# Patient Record
Sex: Female | Born: 1960 | Race: White | Hispanic: No | Marital: Married | Smoking: Never smoker
Health system: Western US, Community
[De-identification: ages and names within clinical notes are randomized; demographics above are authoritative.]

## PROBLEM LIST (undated history)

## (undated) DIAGNOSIS — E28319 Asymptomatic premature menopause: Secondary | ICD-10-CM

## (undated) DIAGNOSIS — F329 Major depressive disorder, single episode, unspecified: Secondary | ICD-10-CM

## (undated) DIAGNOSIS — I10 Essential (primary) hypertension: Secondary | ICD-10-CM

## (undated) DIAGNOSIS — N809 Endometriosis, unspecified: Secondary | ICD-10-CM

## (undated) DIAGNOSIS — F32A Depression, unspecified: Secondary | ICD-10-CM

## (undated) DIAGNOSIS — N979 Female infertility, unspecified: Secondary | ICD-10-CM

## (undated) HISTORY — DX: Major depressive disorder, single episode, unspecified: F32.9

## (undated) HISTORY — DX: Endometriosis, unspecified: N80.9

## (undated) HISTORY — DX: Asymptomatic premature menopause: E28.319

## (undated) HISTORY — PX: LAPAROSCOPY: SHX197

## (undated) HISTORY — DX: Depression, unspecified: F32.A

## (undated) HISTORY — PX: TONSILLECTOMY: SUR1361

## (undated) HISTORY — DX: Female infertility, unspecified: N97.9

## (undated) HISTORY — PX: BREAST CYST ASPIRATION: SHX578

---

## 1997-09-14 ENCOUNTER — Other Ambulatory Visit: Admission: RE | Admit: 1997-09-14 | Discharge: 1997-09-14 | Payer: Self-pay | Admitting: Gynecology

## 1998-08-22 ENCOUNTER — Other Ambulatory Visit: Admission: RE | Admit: 1998-08-22 | Discharge: 1998-08-22 | Payer: Self-pay | Admitting: Gynecology

## 1999-06-04 ENCOUNTER — Other Ambulatory Visit: Admission: RE | Admit: 1999-06-04 | Discharge: 1999-06-04 | Payer: Self-pay | Admitting: Gynecology

## 1999-07-23 ENCOUNTER — Encounter: Payer: Self-pay | Admitting: Gynecology

## 1999-07-23 ENCOUNTER — Encounter: Admission: RE | Admit: 1999-07-23 | Discharge: 1999-07-23 | Payer: Self-pay | Admitting: Gynecology

## 1999-09-27 ENCOUNTER — Encounter: Admission: RE | Admit: 1999-09-27 | Discharge: 1999-09-27 | Payer: Self-pay

## 2000-06-29 ENCOUNTER — Other Ambulatory Visit: Admission: RE | Admit: 2000-06-29 | Discharge: 2000-06-29 | Payer: Self-pay | Admitting: Gynecology

## 2001-08-13 ENCOUNTER — Other Ambulatory Visit: Admission: RE | Admit: 2001-08-13 | Discharge: 2001-08-13 | Payer: Self-pay | Admitting: Gynecology

## 2001-11-01 ENCOUNTER — Encounter: Admission: RE | Admit: 2001-11-01 | Discharge: 2001-11-01 | Payer: Self-pay | Admitting: Gynecology

## 2001-11-01 ENCOUNTER — Encounter: Payer: Self-pay | Admitting: Gynecology

## 2002-09-07 ENCOUNTER — Other Ambulatory Visit: Admission: RE | Admit: 2002-09-07 | Discharge: 2002-09-07 | Payer: Self-pay | Admitting: Gynecology

## 2003-06-09 ENCOUNTER — Encounter: Admission: RE | Admit: 2003-06-09 | Discharge: 2003-06-09 | Payer: Self-pay

## 2003-07-12 ENCOUNTER — Encounter: Admission: RE | Admit: 2003-07-12 | Discharge: 2003-07-12 | Payer: Self-pay | Admitting: Gynecology

## 2003-10-02 ENCOUNTER — Other Ambulatory Visit: Admission: RE | Admit: 2003-10-02 | Discharge: 2003-10-02 | Payer: Self-pay | Admitting: Gynecology

## 2004-02-13 ENCOUNTER — Other Ambulatory Visit: Admission: RE | Admit: 2004-02-13 | Discharge: 2004-02-13 | Payer: Self-pay | Admitting: Gynecology

## 2004-10-08 ENCOUNTER — Other Ambulatory Visit: Admission: RE | Admit: 2004-10-08 | Discharge: 2004-10-08 | Payer: Self-pay | Admitting: Gynecology

## 2004-10-22 ENCOUNTER — Encounter: Admission: RE | Admit: 2004-10-22 | Discharge: 2004-10-22 | Payer: Self-pay | Admitting: Gynecology

## 2005-11-19 ENCOUNTER — Other Ambulatory Visit: Admission: RE | Admit: 2005-11-19 | Discharge: 2005-11-19 | Payer: Self-pay | Admitting: Gynecology

## 2005-11-24 ENCOUNTER — Encounter: Admission: RE | Admit: 2005-11-24 | Discharge: 2005-11-24 | Payer: Self-pay | Admitting: Gynecology

## 2005-12-08 ENCOUNTER — Encounter: Admission: RE | Admit: 2005-12-08 | Discharge: 2005-12-08 | Payer: Self-pay | Admitting: Gynecology

## 2006-12-07 ENCOUNTER — Other Ambulatory Visit: Admission: RE | Admit: 2006-12-07 | Discharge: 2006-12-07 | Payer: Self-pay | Admitting: Gynecology

## 2007-01-20 ENCOUNTER — Encounter: Admission: RE | Admit: 2007-01-20 | Discharge: 2007-01-20 | Payer: Self-pay | Admitting: Gynecology

## 2008-04-04 ENCOUNTER — Encounter: Admission: RE | Admit: 2008-04-04 | Discharge: 2008-04-04 | Payer: Self-pay | Admitting: Gynecology

## 2009-08-13 ENCOUNTER — Encounter: Admission: RE | Admit: 2009-08-13 | Discharge: 2009-08-13 | Payer: Self-pay | Admitting: Gynecology

## 2010-11-18 ENCOUNTER — Encounter: Payer: Self-pay | Admitting: Gastroenterology

## 2010-11-18 HISTORY — PX: COLONOSCOPY: SHX174

## 2010-12-31 ENCOUNTER — Other Ambulatory Visit: Payer: Self-pay | Admitting: Family Medicine

## 2010-12-31 ENCOUNTER — Other Ambulatory Visit: Payer: Self-pay | Admitting: *Deleted

## 2010-12-31 DIAGNOSIS — Z1231 Encounter for screening mammogram for malignant neoplasm of breast: Secondary | ICD-10-CM

## 2011-01-07 ENCOUNTER — Ambulatory Visit
Admission: RE | Admit: 2011-01-07 | Discharge: 2011-01-07 | Disposition: A | Discharge status: Home / Self Care | Payer: BC Managed Care – PPO | Source: Ambulatory Visit | Attending: Family Medicine | Admitting: Family Medicine

## 2011-01-07 DIAGNOSIS — Z1231 Encounter for screening mammogram for malignant neoplasm of breast: Secondary | ICD-10-CM

## 2012-07-09 ENCOUNTER — Ambulatory Visit (HOSPITAL_COMMUNITY)
Admission: AD | Admit: 2012-07-09 | Discharge: 2012-07-09 | Disposition: A | Discharge status: Home / Self Care | Payer: BC Managed Care – PPO | Attending: Psychiatry | Admitting: Psychiatry

## 2012-07-09 ENCOUNTER — Encounter (HOSPITAL_COMMUNITY): Payer: Self-pay | Admitting: *Deleted

## 2012-07-09 DIAGNOSIS — I10 Essential (primary) hypertension: Secondary | ICD-10-CM

## 2012-07-09 DIAGNOSIS — F411 Generalized anxiety disorder: Secondary | ICD-10-CM | POA: Insufficient documentation

## 2012-07-09 DIAGNOSIS — F331 Major depressive disorder, recurrent, moderate: Secondary | ICD-10-CM | POA: Insufficient documentation

## 2012-07-09 HISTORY — DX: Essential (primary) hypertension: I10
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
## 2012-08-11 ENCOUNTER — Other Ambulatory Visit: Payer: Self-pay | Admitting: Family Medicine

## 2012-08-11 DIAGNOSIS — Z780 Asymptomatic menopausal state: Secondary | ICD-10-CM

## 2012-08-11 DIAGNOSIS — Z1231 Encounter for screening mammogram for malignant neoplasm of breast: Secondary | ICD-10-CM

## 2012-09-03 ENCOUNTER — Ambulatory Visit: Payer: Self-pay

## 2012-09-03 ENCOUNTER — Other Ambulatory Visit: Payer: Self-pay

## 2013-03-11 ENCOUNTER — Ambulatory Visit
Admission: RE | Admit: 2013-03-11 | Discharge: 2013-03-11 | Disposition: A | Discharge status: Home / Self Care | Payer: BC Managed Care – PPO | Source: Ambulatory Visit | Attending: Family Medicine | Admitting: Family Medicine

## 2013-03-11 DIAGNOSIS — Z780 Asymptomatic menopausal state: Secondary | ICD-10-CM

## 2013-03-11 DIAGNOSIS — Z1231 Encounter for screening mammogram for malignant neoplasm of breast: Secondary | ICD-10-CM

## 2014-03-10 ENCOUNTER — Other Ambulatory Visit: Payer: Self-pay | Admitting: Family Medicine

## 2014-03-10 DIAGNOSIS — Z1231 Encounter for screening mammogram for malignant neoplasm of breast: Secondary | ICD-10-CM

## 2014-03-17 ENCOUNTER — Ambulatory Visit: Payer: Self-pay

## 2014-03-21 ENCOUNTER — Ambulatory Visit: Payer: Self-pay

## 2014-11-13 ENCOUNTER — Other Ambulatory Visit: Payer: Self-pay

## 2014-11-13 DIAGNOSIS — Z1231 Encounter for screening mammogram for malignant neoplasm of breast: Secondary | ICD-10-CM

## 2014-12-13 ENCOUNTER — Ambulatory Visit
Admission: RE | Admit: 2014-12-13 | Discharge: 2014-12-13 | Disposition: A | Discharge status: Home / Self Care | Payer: BLUE CROSS/BLUE SHIELD | Source: Ambulatory Visit

## 2014-12-13 DIAGNOSIS — Z1231 Encounter for screening mammogram for malignant neoplasm of breast: Secondary | ICD-10-CM

## 2016-02-18 NOTE — Telephone Encounter (Signed)
Patient has a new patient appointment with Claudia Soto on 03/18/16. Patient has been pre-registered and abstracted. Mailed welcome packet on 02/18/16. Patient is aware of the clinic's [facility] medication policy.

## 2016-02-26 ENCOUNTER — Other Ambulatory Visit: Payer: Self-pay | Admitting: Family Medicine

## 2016-02-26 ENCOUNTER — Other Ambulatory Visit: Payer: Self-pay | Admitting: Physician Assistant

## 2016-02-26 DIAGNOSIS — Z1231 Encounter for screening mammogram for malignant neoplasm of breast: Secondary | ICD-10-CM

## 2016-03-04 ENCOUNTER — Ambulatory Visit
Admission: RE | Admit: 2016-03-04 | Discharge: 2016-03-04 | Disposition: A | Discharge status: Home / Self Care | Payer: BLUE CROSS/BLUE SHIELD | Source: Ambulatory Visit | Attending: Physician Assistant | Admitting: Physician Assistant

## 2016-03-04 DIAGNOSIS — Z1231 Encounter for screening mammogram for malignant neoplasm of breast: Secondary | ICD-10-CM

## 2016-03-18 ENCOUNTER — Ambulatory Visit: Admit: 2016-03-18 | Payer: PRIVATE HEALTH INSURANCE | Attending: Family | Primary: MD

## 2016-03-18 DIAGNOSIS — J4540 Moderate persistent asthma, uncomplicated: Secondary | ICD-10-CM

## 2016-03-18 NOTE — Patient Instructions (Addendum)
You may try the Half Somersault Maneuver. You can see a video and get instruction on the website.  [email or web address]    Please have your labs done at your earliest convenience.    Your labs are fasting. You can have your labs drawn at the [facility]), or at [facility] laboratory.     Please remember if your labs need to be fasting,  no food or drink at least 8 hours prior to having them drawn. Water and medications are ok.     Our office will contact you when lab results have been returned and reviewed by your provider.    Lab locations and hours:    [facility]) - 7:30 am to 4 pm, Monday-Friday only  [facility] (Outpatient center) - 6 am - 6 pm, Monday-Friday. Saturday 7:30 am - 1 pm.   [facility]) - 8 am - 5 pm, Monday - Friday only    We have sent in a referral in to ENT and Pulmonary.  They should be contacting you to schedule and appointment for the exam.  If you have not received a call within two weeks please give our office a call.

## 2016-03-18 NOTE — Assessment & Plan Note (Addendum)
Immunizations     Name Date Dose VIS Date Route    Flu Quadrivalent Injectable (PF) 03/18/2016 0.5 mL 01/06/2014 Intramuscular    Site: Right deltoid    Given By: Chari Manning, CMA    Documented By: Chari Manning, CMA    Manufacturer: Sheran Lawless    Lot: [id number]    NDC: [id number]    Expiration Date: 11/30/2018    Tdap 03/18/2016 0.5 mL 07/26/2013 Intramuscular    Site: Right deltoid    Given By: Chari Manning, CMA    Documented By: Chari Manning, CMA    Manufacturer: Sheran Lawless    Lot: [id number]    NDC: [id number]    Expiration Date: 09/26/2017        The patient has been counseled on the importance of yearly influenza vaccination in reducing complications related to the flu, including death, and is offered the vaccine at today's visit. 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 She has received routine flu vaccination screening, meets the criteria required to receive the vaccine, and is provided the most updated VIS. Patient has tolerated the injection well and has no sign of adverse reaction.

## 2016-03-18 NOTE — Assessment & Plan Note (Signed)
Immunizations     Name Date Dose VIS Date Route    Flu Quadrivalent Injectable (PF) 03/18/2016 0.5 mL 01/06/2014 Intramuscular    Site: Right deltoid    Given By: Chari Manning, CMA    Documented By: Chari Manning, CMA    Manufacturer: Sheran Lawless    Lot: [id number]    NDC: [id number]    Expiration Date: 11/30/2018    Tdap 03/18/2016 0.5 mL 07/26/2013 Intramuscular    Site: Right deltoid    Given By: Chari Manning, CMA    Documented By: Chari Manning, CMA    Manufacturer: Sheran Lawless    Lot: [id number]    NDC: [id number]    Expiration Date: 09/26/2017

## 2016-03-18 NOTE — Assessment & Plan Note (Signed)
Stable. Not worsening.  Patient is given information on Half Somersault Maneuver by Dr. Alpha Gula.  Referred to ENT.

## 2016-03-18 NOTE — Progress Notes (Addendum)
Claudia Soto is a 55 y.o. female.  Chief Complaint   Patient presents with   . Establish Care   . Dizziness   . Fatigue       ASSESSMENT & PLAN     Problem List Items Addressed This Visit        Respiratory    Reactive airway disease - Primary (Chronic)     Currently treated with Qvar and albuterol rescue. She has a history of hospitalizations for bronchitis and pneumonia, significantly improved after receiving pneumonia vaccines.  She does not ever recall having pulmonary function testing and she has not had a consult with a pulmonologist.   I have referred her to pulmonology.  For now no changes are made as she feels adequately controlled.  She does not need refills on her inhalers.         Relevant Orders    Referral to Pulmonary Consultants       Other    Vertigo (Chronic)     Stable. Not worsening.  Patient is given information on Half Somers[facility] Maneuver by Dr. Alpha Gula.  Referred to ENT.          Relevant Orders    Referral to ENT ([place])    Tinnitus (Chronic)    Relevant Orders    Referral to ENT ([place])    Flu vaccine need     Immunizations     Name Date Dose VIS Date Route    Flu Quadrivalent Injectable (PF) 03/18/2016 0.5 mL 01/06/2014 Intramuscular    Site: Right deltoid    Given By: Chari Manning, CMA    Documented By: Chari Manning, CMA    Manufacturer: Sheran Lawless    Lot: [id number]    NDC: [id number]    Expiration Date: 11/30/2018    Tdap 03/18/2016 0.5 mL 07/26/2013 Intramuscular    Site: Right deltoid    Given By: Chari Manning, CMA    Documented By: Chari Manning, CMA    Manufacturer: Sheran Lawless    Lot: [id number]    NDC: [id number]    Expiration Date: 09/26/2017        The patient has been counseled on the importance of yearly influenza vaccination in reducing complications related to the flu, including death, and is offered the vaccine at today's visit. 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 She has received routine flu  vaccination screening, meets the criteria required to receive the vaccine, and is provided the most updated VIS. Patient has tolerated the injection well and has no sign of adverse reaction.          Relevant Orders    Flu Vaccine >= 3yo Intramuscular (Preservative Free) (Completed)    Need for Tdap vaccination     Immunizations     Name Date Dose VIS Date Route    Flu Quadrivalent Injectable (PF) 03/18/2016 0.5 mL 01/06/2014 Intramuscular    Site: Right deltoid    Given By: Chari Manning, CMA    Documented By: Chari Manning, CMA    Manufacturer: Sheran Lawless    Lot: [id number]    NDC: [id number]    Expiration Date: 11/30/2018    Tdap 03/18/2016 0.5 mL 07/26/2013 Intramuscular    Site: Right deltoid    Given By: Chari Manning, CMA    Documented By: Chari Manning, CMA    Manufacturer: Sheran Lawless    Lot: [id number]    NDC: [id number]    Expiration Date: 09/26/2017  Relevant Orders    Tdap (ADACEL, BOOSTRIX) (Completed)    Administration of Immunizations, single (Completed)    Fatigue     Started during the summer. She feels significant fatigue and difficulty concentrating.  She admits her weight and deconditioning may be a contributing factor.  We will get routine labs, including thyroid, cbc and iron study.   She is supplementing with Vitamin B12 and vitamin D.  She is encouraged to aim for at least 30 minutes of physical activity 5 days a week.  Further recommendations pending lab results.         Relevant Orders    Thyroid Stimulating Hormone -Routine (Completed)    T3, Free -Routine (Completed)    T4, Free -Routine (Completed)    Ferritin -Routine (Completed)    Iron & Total Iron Binding Capacity -Routine (Completed)    Encounter to establish care    Relevant Orders    25-OH Vitamin D Total D2+D3 -Routine (Completed)    CBC with Auto Differential -Routine (Completed)    Comprehensive Metabolic Panel -Routine (Completed)    Coronary Risk Lipid Panel -Routine (Completed)    Thyroid Stimulating  Hormone -Routine (Completed)      Other Visit Diagnoses     Non-smoker        BMI 40.0-44.9, adult (CMS/HCC)        Encounter for screening mammogram for breast cancer        Relevant Orders    Mammography tomo screening bilateral digital w cad        Health Maintenance  Colonoscopy:She will check records  Hepatitis C Screening: Agrees to screening test.  Mammogram:Due November 2017    Return in about 3 months (around 06/18/2016) for fatigue.    Medications Discontinued During This Encounter   Medication Reason   . fluticasone (FLOVENT HFA) 220 mcg/actuation inhaler Error       We had a thoughtful and careful discussion regarding the issues today. All questions were answered. The patient is in agreement with the plan of care and verbalizes understanding of all instructions.  I encouraged followup as needed.     SUBJECTIVE     Claudia Soto is a 55 y.o. female who presents today for Establish Care; Dizziness; and Fatigue       Patient history, allergies, medications and HPI were given by patient.     Patient Care Team:  Aleda Grana, FNP-C as PCP - General (Family Medicine)    HISTORY OF PRESENT ILLNESS     HPI Comments:   The patient is here to establish care, she is transferring from Dr. Josepha Pigg.     She resides in [place].     Fatigue:  She has been tired for the last few months. Also having brain fog.  She has tried iron but did not help. She had a single episode of palpitations two nights ago, felt fast pounding in her chest for about 5 seconds. No PND, no claudication. Has dyspnea on exertion, none at rest. No history of thyroid problems.   Difficulty losing weight. Hx of 100 lb weight loss with diet and exercise but regained.   History of anemia during childbearing years.    Lung Issues:  From a child she notes lung problems, URI's easily turns into bronchitis and pneumonia.  Receiving the pneumonia vaccine seemed to help. Her last hospitalization for respiratory issues was about 12 years ago.  She has  been using Qvar and albuterol rescue inhaler.  She has never seen  a pulmonologist or had pulmonary function test.  She experiences shortness of breath with exertion, sometimes awakens at night with cough.  Has never smoked. Denies exposures to respiratory toxins.    Right ear tinnitus:  Occurred after a very bad ear/sinus infection about 10 years ago. She also experienced vertigo. Up until 2 months ago the vertigo had stopped. She has had two episodes of pretty severe vertigo in the last couple of months though, each lasting a couple of days. She uses Flonase and Zyrtec which is helpful for the ringing.     Health Maintenance  Colonoscopy:(Family Hx of colon polyps) 3 years ago - repeat at 5 yr  Hepatitis C Screening (birth year 81-1965): Never  Lung Cancer Screening:N/A    Women  DXA: N/A  Mammogram:04/18/15  Pap Smear: Total hysterectomy    Father with AAA    Vaccines:  Tetanus: Due  Pneumonia:Pneumo 23 given 01/03/15, previously received Prevnar - Complete    Past Medical History:   Diagnosis Date   . Anemia 1982   . 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 Asthma 1960/12/18    seasonal asthma, diagnosed as an infant   . Bronchitis     Patient has had several times   . GERD (gastroesophageal reflux disease) 2007   . Miscarriage     2x   . Pneumonia     Patient has had several times       Past Surgical History:   Procedure Laterality Date   . HYSTERECTOMY  07/2003   . LASIK Bilateral 2007    2x   . MOUTH SURGERY  2005    Had to front teeth pulled and replaced with implants   . OOPHORECTOMY Right 07/2003       Allergies   Allergen Reactions   . Vicodin [Hydrocodone-Acetaminophen] Nausea And Vomiting       Outpatient Prescriptions Marked as Taking for the 03/18/16 encounter (Office Visit) with Aleda Grana, FNP-C   Medication Sig Dispense Refill   . beclomethasone (QVAR) 80 mcg/actuation inhaler Inhale 2 puffs into the lungs 2 times daily.     Marland Kitchen BLACK COHOSH ORAL Take by mouth daily.     Marland Kitchen CALCIUM-MAGNESIUM ORAL  Take by mouth daily.     . cetirizine (ZYRTEC) 10 mg Cap Take by mouth daily.     Graciella Freer MED daily. Med Name: Thyroid Energy- once daily and Bladderwrack- once daily     . cyanocobalamin, vitamin B-12, (VITAMIN B-12) 1,000 mcg Subl Place under the tongue daily.     . fluticasone (FLONASE) 50 mcg/actuation nasal spray 1 spray by Each Nare route daily.     . MULTIVITAMIN ORAL Take by mouth daily.     . [DISCONTINUED] fluticasone (FLOVENT HFA) 220 mcg/actuation inhaler Inhale 2 puffs into the lungs daily as needed.         Social History     Social History Main Topics   . Smoking status: Never Smoker   . Smokeless tobacco: Never Used      Comment: No smoke exposure   . Alcohol use No   . Drug use: No   . Sexual activity: Yes     Partners: Male     Birth control/ protection: Surgical     Social History   . Marital status: Married     Spouse name: Nadine Counts   . Number of children: 5   . Years of education: N/A     Other Topics  Concern   . Caffeine Concern Yes     1 cup of coffee or a can of soda daily   . Exercise Yes     walking the dogs or goes to the gym  5-6 days weekly       Family History   Problem Relation Age of Onset   . Cervical cancer Mother    . Aortic aneurysm Father    . Colon polyps Father    . Parkinsonism Father    . Alcohol abuse Sister    . No Known Health Problems Brother    . No Known Health Problems Daughter    . No Known Health Problems Son    . Alzheimer's disease Maternal Grandmother    . Diabetes Paternal Grandfather    . No Known Health Problems Son    . No Known Health Problems Daughter    . No Known Health Problems Daughter        Health Maintenance       Date Due Completion Dates    CERVICAL CANCER SCREENING 07/13/1981 ---    COLORECTAL CANCER SCREENING 07/13/2010 ---    BREAST CANCER SCREENING 04/17/2017 04/18/2015          The following portions of the patient's history were reviewed and updated as appropriate: allergies, current medications, family history, past medical history, past surgical  history and problem list.    REVIEW OF SYSTEMS     Review of Systems   Constitutional: Positive for fatigue. Negative for activity change, appetite change, fever and unexpected weight change.   HENT: Positive for tinnitus (right). Negative for sore throat and trouble swallowing.    Respiratory: Positive for shortness of breath. Negative for cough.    Cardiovascular: Positive for palpitations and leg swelling (with prolonged sitting). Negative for chest pain.   Gastrointestinal: Negative for abdominal distention, abdominal pain, constipation, diarrhea, nausea and vomiting.   Genitourinary: Negative for dysuria and hematuria.   Musculoskeletal: Positive for arthralgias (knees).   Skin:        Dry skin.  No new, concerning, or changing skin lesions   Neurological: Negative for weakness, light-headedness and headaches.   Psychiatric/Behavioral: Positive for sleep disturbance (some nights).       OBJECTIVE     BP 130/80 (BP Location: Left arm, Patient Position: Sitting)  Pulse 89  Temp 36.4 ?C (97.6 ?F) (Oral)   Resp 16  Ht 5' 8 (1.727 m)  Wt 266 lb 9.6 oz (120.9 kg)  SpO2 97%  BMI 40.54 kg/m2  BP Readings from Last 3 Encounters:   03/18/16 130/80     Wt Readings from Last 3 Encounters:   03/18/16 266 lb 9.6 oz (120.9 kg)     PF Readings from Last 3 Encounters:   No data found for PF     Body mass index is 40.54 kg/(m^2).    No results found for this visit on 03/18/16 (from the past 48 hour(s)).     PHYSICAL EXAM     Physical Exam   Constitutional: She is oriented to person, place, and time and well-developed, well-nourished, and in no distress.   HENT:   Head: Normocephalic and atraumatic.   Nose: Nose normal.   Mouth/Throat: Uvula is midline, oropharynx is clear and moist and mucous membranes are normal.    Hearing normal to conversational voice. External ear and canal normal. TMs pearly gray with cone of light and bony landmarks visible.    Eyes: Pupils are equal, round, and  reactive to light.   Eyes are  moist, sclera clear without icterus, conjunctiva pink   Neck: Trachea normal. Neck supple. Normal carotid pulses present. Carotid bruit is not present. No thyroid mass and no thyromegaly present.   Cardiovascular: Normal rate, regular rhythm and normal heart sounds.  Exam reveals no gallop and no friction rub.    No murmur heard.  Carotid and radial pulses are 2+ and equal bilaterally.  No edema is present.   Pulmonary/Chest: Effort normal and breath sounds normal.   Abdominal: Soft. Normal appearance and bowel sounds are normal. There is no tenderness.   Lymphadenopathy:   No head or cervical lymphadenopathy.    Neurological: She is alert and oriented to person, place, and time. Gait normal.   Patient sitting upright in exam chair, rises easily to stand. Reflexes 2+ and equal bilaterally.   Skin: Skin is warm, dry and intact. No rash noted. No cyanosis.   Psychiatric:   Pleasant mood, affect appropriate. Engaged in visit and has good insight into matters of her  health. Able to recall recent and past memories.   Vitals reviewed.    ----------------------------------------------------------------------------------------------------------------------    This note was transcribed using speech recognition software. Please contact us for clarification if any questions arise relating to the wording of this document.

## 2016-03-18 NOTE — Assessment & Plan Note (Signed)
Started during the summer. She feels significant fatigue and difficulty concentrating.  She admits her weight and deconditioning may be a contributing factor.  We will get routine labs, including thyroid, cbc and iron study.   She is supplementing with Vitamin B12 and vitamin D.  She is encouraged to aim for at least 30 minutes of physical activity 5 days a week.  Further recommendations pending lab results.

## 2016-03-18 NOTE — Assessment & Plan Note (Addendum)
Currently treated with Qvar and albuterol rescue. She has a history of hospitalizations for bronchitis and pneumonia, significantly improved after receiving pneumonia vaccines.  She does not ever recall having pulmonary function testing and she has not had a consult with a pulmonologist.   I have referred her to pulmonology.  For now no changes are made as she feels adequately controlled.  She does not need refills on her inhalers.

## 2016-03-19 MED ORDER — cholecalciferol, vitamin D3, 5,000 unit capsule
125 | ORAL_CAPSULE | Freq: Every day | ORAL | 2 refills | 43.00000 days | Status: DC
Start: 2016-03-19 — End: 2016-06-17

## 2016-04-21 ENCOUNTER — Ambulatory Visit: Payer: PRIVATE HEALTH INSURANCE | Primary: MD

## 2016-05-19 ENCOUNTER — Encounter: Payer: PRIVATE HEALTH INSURANCE | Attending: Medical | Primary: MD

## 2016-05-19 ENCOUNTER — Encounter: Payer: PRIVATE HEALTH INSURANCE | Primary: MD

## 2016-06-16 NOTE — Progress Notes (Signed)
Patient has appt in our office tomorrow with provider. Per APP protocol - generated chest x-ray order to be done prior to appt.    Closing encounter.

## 2016-06-17 ENCOUNTER — Ambulatory Visit
Admit: 2016-06-17 | Discharge: 2016-06-17 | Payer: PRIVATE HEALTH INSURANCE | Attending: Critical Care Medicine | Primary: MD

## 2016-06-17 ENCOUNTER — Ambulatory Visit: Admit: 2016-06-17 | Payer: PRIVATE HEALTH INSURANCE | Attending: Audiologist | Primary: MD

## 2016-06-17 ENCOUNTER — Ambulatory Visit: Admit: 2016-06-17 | Payer: PRIVATE HEALTH INSURANCE | Attending: Medical | Primary: MD

## 2016-06-17 ENCOUNTER — Inpatient Hospital Stay
Admit: 2016-06-17 | Discharge: 2016-06-17 | Payer: PRIVATE HEALTH INSURANCE | Attending: Critical Care Medicine | Primary: MD

## 2016-06-17 DIAGNOSIS — Z1383 Encounter for screening for respiratory disorder NEC: Secondary | ICD-10-CM

## 2016-06-17 DIAGNOSIS — H9319 Tinnitus, unspecified ear: Secondary | ICD-10-CM

## 2016-06-17 DIAGNOSIS — J45909 Unspecified asthma, uncomplicated: Secondary | ICD-10-CM

## 2016-06-17 DIAGNOSIS — R42 Dizziness and giddiness: Secondary | ICD-10-CM

## 2016-06-17 NOTE — Progress Notes (Signed)
PATIENT:    Claudia Soto  DATE:   06/17/2016   DATE OF BIRTH:   01/06/1961    Thank you for your kind referral please feel free to contact me with any questions at [phone]  Or directly On DOC halo.    Assessment and Plan:  -Asthma:   based on clinical symptoms.  She does not have any foxed airway obstruction.  She has npo emphysema on CT scan and never smoked.  The diagnosis of asthma is based on clinical symptoms and response to inhaled steroids.  I do not feel Methacholine challenge is necessary.    Cont q var 80 1 puff BID.  If needing albuterol more than 2 times/ week change to 2 puffs BID.      Obesity:    Insomnia: referred to insomnia clinic.  I would not consider Sleep apnea testing until her insomnia is better controlled.        DIAGNOSITIC STUDIES: (Personally reviewed)    Images:chest x-ray images reviewed and shows no acute abnormality.  PFTs:Normal spirometry w no sig response to inhaled bronchodilators.   Normal lung volumes.  Normal diffusion not corrected for HB.  The use of ICS may affect bronchodilator response results  Labs:CBC: HB: 13.6   eos 100    REFERRRING PROVIDER: Aleda Grana., *      HISTORY OF PRESENT ILLNESS:   This is a 56 y.o. female with a history of asthma, anemia, insomnia  She experiences dyspnea and wheezing when exposed to strong smells, perfumes.  She had the symptoms for many years and was told she had COPD.  She had those symptoms for years, they respond to albuteorl, have been stable over the years.  She uses her albuterol 1-2 weekly.  Never smoked.      The patient denies occupational exposure.  The patient denies exposure to medications that can cause lung disease, radiation or chemotherapy.        Review of systems:  Constitutional:  No recent fevers, chills, weight gain, night sweats.   ENT:  No facial pain/pressure, rhinorrhea/bloody nasal discharge, nasal stuffiness, difficulty swallowing, odynophagia, or change in voice/hoarseness.  Eyes:  no  discharge, no itching.  Pulmonary: SEE HPI, negative otherwise.  Cardiovascular:  No Orthopnea, Paroxysmal Nocturnal Dyspnea,   GI:  No abdominal pain, nausea, vomiting, hematochezia, melena, heartburn, or reflux.    Endocrine:  No flushing, symptomatic hypoglycemia, polyuria, or polydipsia.   GU:  No dysuria, hematuria, frequency, urgency.  Musculoskeletal:  No new arthritis, arthralgias, myalgias   Dermatologic: No new rashes, pruritis, discoloration   Allergy/ immunology: no food allergies, no immunocompromise.  Neuro:  No new headache, visual disturbance, new muscle weakness, imbalance, seizure, history of stroke.   Hematologic: No easy bruising, easy/excessive bleeding.  Psychiatry: no agitation, no behavioral changes.      Family History   Problem Relation Age of Onset   . Cervical cancer Mother    . Aortic aneurysm Father    . Colon polyps Father    . Parkinsonism Father    . Alcohol abuse Sister    . No Known Health Problems Brother    . No Known Health Problems Daughter    . No Known Health Problems Son    . Alzheimer's disease Maternal Grandmother    . Diabetes Paternal Grandfather    . No Known Health Problems Son    . No Known Health Problems Daughter    . No Known Health Problems Daughter  Family history was reviewed and not pertinent .    Past Medical History:   Diagnosis Date   . Anemia 1982   . 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 Asthma 01-30-61    seasonal asthma, diagnosed as an infant   . Bronchitis     Patient has had several times   . Cough    . GERD (gastroesophageal reflux disease) 2007   . Insomnia 2000   . Miscarriage     2x   . Pneumonia     Patient has had several times   . Vertigo 2008       Past Surgical History:   Procedure Laterality Date   . HYSTERECTOMY  07/2003   . LASIK Bilateral 2007    2x   . MOUTH SURGERY  2005    Had to front teeth pulled and replaced with implants   . OOPHORECTOMY Right 07/2003       Social History     Social History Main Topics   . Smoking status: Never  Smoker   . Smokeless tobacco: Never Used      Comment: No smoke exposure   . Alcohol use No   . Drug use: No   . Sexual activity: No     Social History   . Marital status: Married     Spouse name: Nadine Counts   . Number of children: 5   . Years of education: 76     Other Topics Concern   . Caffeine Concern Yes     1-2 cups of coffee daily   . Exercise Yes     walking the dogs or goes to the gym  5-6 days weekly       Patient Active Problem List    Diagnosis SNOMED CT(R) Date Noted   . Dizziness DIZZINESS 06/17/2016   . Reactive airway disease REACTIVE AIRWAY DISEASE 03/18/2016   . Vertigo VERTIGO 03/18/2016   . Tinnitus TINNITUS 03/18/2016   . Flu vaccine need NEEDS INFLUENZA IMMUNIZATION 03/18/2016   . Need for Tdap vaccination REQUIRES DIPHTHERIA, TETANUS AND PERTUSSIS VACCINATION 03/18/2016   . Fatigue FATIGUE 03/18/2016   . Encounter to establish care ADMINISTRATIVE STATUSES 03/18/2016       Current Outpatient Prescriptions on File Prior to Visit   Medication Sig Dispense Refill   . beclomethasone (QVAR) 80 mcg/actuation inhaler Inhale 2 puffs into the lungs 2 times daily.     Marland Kitchen BLACK COHOSH ORAL Take by mouth daily.     Marland Kitchen CALCIUM-MAGNESIUM ORAL Take by mouth daily.     . cetirizine (ZYRTEC) 10 mg Cap Take by mouth as needed.      Graciella Freer MED daily. Med Name: Thyroid Energy- once daily    Bladderwrack- once daily   Garlinase 1 per day  Grapefruit seed extract 2 per day  Pay d'arco 500mg  2 per day  DGL 2 3 times a day     . cyanocobalamin, vitamin B-12, (VITAMIN B-12) 1,000 mcg Subl Place under the tongue daily.     . fluticasone (FLONASE) 50 mcg/actuation nasal spray 2 sprays by Each Nare route daily.      . MULTIVITAMIN ORAL Take by mouth daily. Women's Multi       No current facility-administered medications on file prior to visit.        Allergies   Allergen Reactions   . Citrus Bioflavonoids  [Bioflavonoids] Other (See Comments)   . Egg Derived Other (See Comments)   .  Vicodin [Hydrocodone-Acetaminophen]  Nausea And Vomiting         Social History   Substance Use Topics   . Smoking status: Never Smoker   . Smokeless tobacco: Never Used      Comment: No smoke exposure   . Alcohol use No           PHYSICAL EXAMINATION:  BP 125/73 (BP Location: Left arm, Patient Position: Sitting)   Pulse 74   Temp 36.3 ?C (97.4 ?F) (Tympanic)   Ht 5' 7 (1.702 m)   Wt 263 lb 6.4 oz (119.5 kg)   SpO2 96% Comment: R/A  BMI 41.25 kg/m?   Body mass index is 41.25 kg/m?Marland Kitchen  GENERAL APPEARANCE:  Comfortable, in no acute distress.  HEAD: Normocephalic.  EYES:  PERRLA, no pallor, mot jaundiced.  ENT, MOUTH:  Normal external ear canal, normal oral and nasal mucosa,nor posterior pharynx, soft and hard palate, normal septum and turbinates , no thrush, nor teeth and gums  NECK:  Supple neck, symmetrical, no deformity, trachea midline,normal thyroid size no palpable nodules,  no LAP.normal jugular veins no distention.   RESPIRATORY:    Symmetrical chest with normal expansion.  Normal  respiratory effort  Good air entry bilateral,   Symmetrical resonant percussion of the chest bilaterally.  No tenderness to palpation.  CARDIOVASCULAR: NL S1 and S2, no M/R/G, normal peripheral pulses, no signs of vasculopathy.   GASTROINTESTINAL:  Soft and lax, no swelling, no tenderness, no organomegaly.  Lymphatic: no cervical or axillary LAP.  MUSCULOSKELETAL: no joint swelling, no redness, no clubbing.  SKIN:  No skin rashes, or lesions, no tenderness to plapation.  NEUROLOGICAL:  Normal gait, nor mal strength in all extremities.  PSYCHIATRIC:  Oriented to time place and person, Normal affect, normal thought process, no signs of depression.  HEMATOLOGIC/LYMPHATIC/IMMUNOLOGIC: No LAP , no ecchymosis          Elyn Peers, MD    CC: Marcucilli, Milas Kocher., *, Aleda Grana, FNP-C      This note was transcribed using speech recognition software. Please contact us for clarification if any questions arise relating to the wording of this document.

## 2016-06-17 NOTE — Progress Notes (Signed)
SUBJECTIVE:     Claudia Soto 56 y.o. female DOB 08/10/1960 who presents for   Chief Complaint   Patient presents with   . Dizziness   . Tinnitus         Otoscopy revealed clear ear canals.    Tympanometric results in right ear were consistent with normal middle ear function. Tympanometric results in left ear were consistent with normal middle ear function.    Pure tone testing showed hearing within normal limits bilaterally, from [id number] Hz.    Speech Reception Threshold in the right ear was 15 dB.  Speech Reception Threshold in the left ear was 15 dB.    Word Recognition score in right ear was excellent. Word Recognition score in left ear was excellent.            ICD-9-CM ICD-10-CM    1. Subjective tinnitus, unspecified laterality 388.31 H93.19 Comprehensive hearing test      Tympanometry         Sandrea Hammond, Au.D.  Doctor of Audiology

## 2016-06-17 NOTE — Progress Notes (Signed)
Chief Complaint   Patient presents with   . Dizziness         HISTORY OF PRESENT ILLNESS     Patient is a 56 y.o. female referred by Claudia Soto, F* who is here for evaluation of spinning sensation that began approximately 2009. Symptoms are have been worse since April 2017 and are worsened. The episodes are come and go. Symptoms seem to be triggered by no identified factor and happens without warning. During the episodes patient denies any hearing loss or tinnitus fluctuations in None ear(s).    The onset of symptoms may have been associated with other cause of ear infections. Patient has not noticed any relationship between the dizziness and eating certain foods.Patient does not use cotton swabs or other object in ears.    Additional associated symptoms include: weakness, uncoordination, nausea, vomiting, fatigue and sweats. Patient specifically denies: unusual headache, vision changes, passing out, numbness/tingling, chest pain, fever, shortness of breath, ear pain, ear drainage and anxiety.    Patient does not have a past medical history of any neurological disorder, anemia, autoimmune disorder, prolonged loud noise exposure, or ear disorder/surgery.    Previous diagnostic studies for patient's chief complaint have not been performed recently. Patient has not been evaluated by another specialist.    Patient states that she has been getting intermittent vertigo symptoms (spinning sensations) over the past 9 months. She states that she has had 4-5 of these episodes during that time. The spinning sensation that can occur without warning and without any identifiable trigger. Position changes do not seem to bring it on and they can happen even when she is sitting still. The vertigo episodes will typically last several hours and have lasted as long as 2 days. She usually takes Dramamine when they occur and will sit in a reclining chair with her eyes closed until it passes. She does not notice any hearing loss  fluctuations but she does have constant tinnitus in her right ear and occasionally she will feel fullness in her right ear when lying on her right side. She has no significant ear history otherwise she had severe infection about 9 or 10 years ago.          Past Medical History:   Diagnosis Date   . Anemia 1982   . 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 Asthma [birth year] 04, 1961    seasonal asthma, diagnosed as an infant   . Bronchitis     Patient has had several times   . Cough    . GERD (gastroesophageal reflux disease) 2007   . Insomnia 2000   . Miscarriage     2x   . Pneumonia     Patient has had several times   . Vertigo 2008     Past Surgical History:   Procedure Laterality Date   . HYSTERECTOMY  07/2003   . LASIK Bilateral 2007    2x   . MOUTH SURGERY  2005    Had to front teeth pulled and replaced with implants   . OOPHORECTOMY Right 07/2003     Family History   Problem Relation Age of Onset   . Cervical cancer Mother    . Aortic aneurysm Father    . Colon polyps Father    . Parkinsonism Father    . Alcohol abuse Sister    . No Known Health Problems Brother    . No Known Health Problems Daughter    . No Known Health  Problems Son    . Alzheimer's disease Maternal Grandmother    . Diabetes Paternal Grandfather    . No Known Health Problems Son    . No Known Health Problems Daughter    . No Known Health Problems Daughter      Allergies   Allergen Reactions   . Citrus Bioflavonoids  [Bioflavonoids] Other (See Comments)   . Egg Derived Other (See Comments)   . Vicodin [Hydrocodone-Acetaminophen] Nausea And Vomiting     Outpatient Prescriptions Marked as Taking for the 06/17/16 encounter (Office Visit) with Lucianne Muss, PA   Medication Sig Dispense Refill   . beclomethasone (QVAR) 80 mcg/actuation inhaler Inhale 2 puffs into the lungs 2 times daily.     Marland Kitchen BLACK COHOSH ORAL Take by mouth daily.     Marland Kitchen CALCIUM-MAGNESIUM ORAL Take by mouth daily.     . cetirizine (ZYRTEC) 10 mg Cap Take by mouth as needed.      Graciella Freer MED  daily. Med Name: Thyroid Energy- once daily    Bladderwrack- once daily   Garlinase 1 per day  Grapefruit seed extract 2 per day  Pay d'arco 500mg  2 per day  DGL 2 3 times a day     . cyanocobalamin, vitamin B-12, (VITAMIN B-12) 1,000 mcg Subl Place under the tongue daily.     . fluticasone (FLONASE) 50 mcg/actuation nasal spray 2 sprays by Each Nare route daily.      . MULTIVITAMIN ORAL Take by mouth daily. Women's Multi       Social History     Social History Main Topics   . Smoking status: Never Smoker   . Smokeless tobacco: Never Used      Comment: No smoke exposure   . Alcohol use No   . Drug use: No   . Sexual activity: No     Social History   . Marital status: Married     Spouse name: Nadine Counts   . Number of children: 5   . Years of education: 23     Other Topics Concern   . Caffeine Concern Yes     1-2 cups of coffee daily   . Exercise Yes     walking the dogs or goes to the gym  5-6 days weekly     History   Smoking Status   . Never Smoker   Smokeless Tobacco   . Never Used     Comment: No smoke exposure       REVIEW OF SYSTEMS   Review of Systems   Constitutional: Positive for malaise/fatigue.   HENT: Positive for congestion, ear pain, hearing loss and tinnitus.    Eyes: Positive for blurred vision, photophobia and redness.   Respiratory: Positive for cough, sputum production, shortness of breath and wheezing.    Cardiovascular: Positive for orthopnea.   Genitourinary: Positive for urgency.   Musculoskeletal: Positive for back pain and joint pain.   Skin: Positive for rash.   Neurological: Positive for dizziness and headaches.     12 point review of systems negative except as noted above.    PHYSICAL EXAM     Body mass index is 39.39 kg/m?Marland Kitchen  Vitals:    06/17/16 1020 06/17/16 1024   BP: 131/85 143/87   Patient Position: Sitting Standing   Pulse: 72 75   Temp: 36.1 ?C (96.9 ?F)    Weight: 259 lb (117.5 kg)    Height: 5' 7.99 (1.727 m)  PHYSICAL EXAMINATION:    CONSTITUTIONAL:  General  Appearance: well nourished, well-developed, alert, oriented, in no acute distress, cooperative with examination  Communication Ability / Voice Quality : communication ability normal, voice quality normal, English     HEAD:  Inspection : normocephalic; symmetrical, no lesions present, no evidence of trauma   Palpation : no tenderness on palpation, no masses on palpation     FACE:  Inspection : normal appearance, no lesions present, no evidence of trauma, jaw position normal   Palpation : frontoethmoidal sinus nontender, maxillary sinuses nontender to palpation, no masses present   Facial Strength : facial motion symmetric, normal eye closure strength bilaterally   Parotid Glands : no tenderness on palpation, no swelling present, no masses present     EYES:  Ocular Motility/Alignment : ocular alignment normal, ocular motility normal, no nystagmus present, no proptosis present   Eyelids : eyelids within normal limits, lacrimal glands within normal limits, orbits within normal limits, no proptosis present   Conjunctiva : conjunctiva normal     EARS:  Hearing : NORMAL AUDIOMETRICS, BILATERAL TYPE A TYMPANOMETRY  External Ears : auricles anatomically normal bilaterally, no auricle lesions present, no auricle tenderness to palpation present   External Auditory Canals: ear canals with no significant cerumen, external auditory canals have normal appearance, no external auditory canal lesions present  Otoscopic/Microscopic Exam :   Right ear: tympanic membranes appear normal, no tympanic membrane lesions present, no tympanic membrane perforations present, no fluid present behind tympanic membranes  Left ear: tympanic membranes appear normal, no tympanic membrane lesions present, no tympanic membrane perforations present, no fluid present behind tympanic membranes  Vestibular System : DIX-HALLPIKE MANEUVER NEGATIVE    NOSE / NASOPHARYNX:  External Nose : appearance normal, no tenderness on palpation, no significant nasal  discharge present, no lesions, no evidence of trauma, nostrils patent without discharge   Intranasal Exam : nasal mucosa pink, moist and within normal limits, vestibule within normal limits, inferior turbinates appear normal, middle turbinates appear normal, nasal septum relatively midline, nasal discharge is clear and minimal, no polyposis seen     ORAL CAVITY / OROPHARYNX:  Lips : upper and lower lips pink, moist, and normal appearing.   Teeth : dentition within normal limits for age   Gums : gingivae healthy   Oral Mucosa : oral mucosa pink and moist with no lesions present   Floor of Mouth : floor of mouth within normal limits, salivary ducts appear patent   Tongue : tongue moist, midline, with symmetrical movements with no lesions seen.   Palate : soft and hard palates within normal limits   Oropharynx : appearance within normal limits, tonsils normal in appearance, peritonsillar regions within normal limits     NECK:  Inspection and Palpation : appearance normal, no masses or tenderness on palpation; trachea midline and freely movable   Thyroid : size of gland normal, no tenderness, nodules or mass present on palpation, position midline   Submandibular Glands : normal size, nontender to palpation   Lymph Nodes : no lymphadenopathy present     CHEST / RESPIRATORY:  Respiratory Effort : breathing is symmetrical and unlabored     CARDIOVASCULAR:  Carotid: normal pulses; no bruits    SKIN AND SUBCUTANEOUS TISSUES:  Skin : normal coloration and skin turgor for age, no rashes     NEUROLOGICAL / PSYCHIATRIC:  Orientation : oriented to time, place and person   Mood and Affect : mood normal, affect appropriate   Cranial  Nerves : CN II-XII appear intact   Gait : ROMBERG MANEUVER NEGATIVE         ASSESSMENT       ICD-9-CM ICD-10-CM    1. Dizziness 780.4 R42 Referral to Audiology (External)      MRI brain with and without contrast   2. 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 Subjective  tinnitus, right 388.31 H93.11         PLAN     Patient with intermittent vertigo that has happened 4 or 5 times over the past 9 months. Symptoms can happen without warning and are not dependent on position changes. She does not have hearing loss or tinnitus fluctuations. However, she does have constant right ear tinnitus that has been present for 9 or 10 years since having a severe infection in the right ear. Occasionally she will feel a fullness in the right ear when she lies down on that side.    Audiometrics demonstrated normal and symmetrical hearing bilaterally with type A tympanograms. Dix-Hallpike and Romberg maneuvers were negative. Her last episode of vertigo was a week and half ago. Etiology is unclear but her symptoms are fairly consistent with possible M?ni?re's disease. We discussed strict low-sodium diet of less than 1500 mg/day. She does not wish to start a diuretic at this time.    I will obtain an MRI of the brain with contrast and a VNG and have her return in 2 months with Dr. Harriette Ohara for evaluation. Patient is instructed to keep a diary of surrounding events when the episodes occur (things she ate or drank, sleep habits, stressful environments, etc.) In an attempt to identify triggers.    Patient instructions were provided and all questions answered.    Return in about 2 months (around 08/15/2016) for Vertigo recheck.    *This note was dictated using voice recognition software. If you have any questions concerning the content, please call our office at [phone].*

## 2016-06-18 ENCOUNTER — Ambulatory Visit: Admit: 2016-06-18 | Discharge: 2016-06-25 | Payer: PRIVATE HEALTH INSURANCE | Attending: Family | Primary: MD

## 2016-06-18 ENCOUNTER — Inpatient Hospital Stay: Admit: 2016-06-18 | Payer: PRIVATE HEALTH INSURANCE | Attending: Family | Primary: MD

## 2016-06-18 DIAGNOSIS — Z1231 Encounter for screening mammogram for malignant neoplasm of breast: Secondary | ICD-10-CM

## 2016-06-18 DIAGNOSIS — R5383 Other fatigue: Secondary | ICD-10-CM

## 2016-06-18 NOTE — Patient Instructions (Addendum)
You may find useful information from Dr. Sinclair Grooms web site. He is a functional medicine specialist.   [email or web address]    Paleo and Ketogenic diets are helpful for inflammation and weight loss.    I recommend Practical Paleo by [employer]    Please have your labs done June.  Your labs are not fasting. You can have your labs drawn at the [facility]), or at [facility] laboratory.     Please remember if your labs need to be fasting,  no food or drink at least 8 hours prior to having them drawn. Water and medications are ok.     You may access your results via MyChart. You will be notified by phone or MyChart of any abnormal results.    Lab locations and hours:  [facility]) - 7:30 am to 4 pm, Monday-Friday only  [facility] (Outpatient center) - 6 am - 6 pm, Monday-Friday. Saturday 7:30 am - 1 pm.   [facility] [place]) - 8 am - 5 pm, Monday - Friday only

## 2016-06-18 NOTE — Assessment & Plan Note (Addendum)
The patient has met with pulmonology and had PFTs performed. She has a mild asthma, possibly induced from an allergy. The pulmonologist has discussed trying a gluten-free diet, which she is motivated to start. She will contact Dr. Rebecca Eaton office to inquire about the labs.   -Qvar 1 puff bid, albuterol rescue as needed. She will increase her Qvar to 2 puffs twice a day if she is using her albuterol more than twice a week.  -Follow up with Dr. Shirline Frees one year.

## 2016-06-18 NOTE — Progress Notes (Signed)
I have reviewed the patient's mammogram.  It is negative. No sign of malignancy. Please call the patient to inform her. Thank you.

## 2016-06-18 NOTE — Progress Notes (Signed)
Claudia Soto is a 56 y.o. female.  Chief Complaint   Patient presents with   . Fatigue       ASSESSMENT & PLAN     Problem List Items Addressed This Visit        High    Allergy-induced asthma     The patient has met with pulmonology and had PFTs performed. She has a mild asthma, possibly induced from an allergy. The pulmonologist has discussed trying a gluten-free diet, which she is motivated to start. She will contact Dr. Rebecca Eaton office to inquire about the labs.   -Qvar 1 puff bid, albuterol rescue as needed. She will increase her Qvar to 2 puffs twice a day if she is using her albuterol more than twice a week.  -Follow up with Dr. Shirline Frees one year.         Fatigue - Primary     No improvement. She believes her weight and deconditioning are the main reason. She is starting a gluten free diet and is hopeful this will help with weight loss. She continues to supplement B12 and vitamin D.  -I have reviewed benefits of a Paleo or Ketogenic diet in weight loss and autoimmune processes. She is also referred to Dr. Loraine Leriche Hymen's web site on functional medicine.  -I have praised her efforts at focusing on lifestyle interventions to treat her fatigue. I would like her to return if her symptoms worsen, she experiences new symptoms, or she has difficulty making these changes.          Relevant Orders    CBC with Auto Differential -Routine    Ferritin -Routine       Medium    Dizziness     She will follow-up with Dr. fear for the VNG testing. Considering Meniere's disease as her diagnosis. She has cut down her salt intake as advised.           Other Visit Diagnoses     Morbid obesity (CMS/HCC)            Return in about 1 year (around 06/18/2017).    There are no discontinued medications.    We had a thoughtful and careful discussion regarding the issues today. All questions were answered. The patient is in agreement with the plan of care and verbalizes understanding of all instructions.  I encouraged followup as  needed.     SUBJECTIVE     Claudia Soto is a 56 y.o. female who presents today for Fatigue       Patient history, allergies, medications and HPI were given by patient.     Patient Care Team:  Aleda Grana, FNP-C as PCP - General (Family Medicine)    HISTORY OF PRESENT ILLNESS     Patient is here for a follow up for fatigue.    She has seen pulmonology and thinks she has allergy related asthma. She does not have emphysema on CT. She is using the Qvar 1 puff bid and albuterol PRN with instruction to increase to bid if she is using albuterol more than 2 times a week. She was advised she may have gluten intolerance.     Fatigue:  She has been tired for the last few months. Also having brain fog.  Difficulty losing weight. Hx of 100 lb weight loss with diet and exercise but regained. Feels her weight is probably the reason. She is going to try a gluten free diet, using the Blood Type Diet.  Vertigo:  She is seeing ENT. Considering Meniere's disease. She is schedule to see Dr. Harriette Ohara in a few months with VNG testing. Going to cut down her salt.    Health Maintenance  Colonoscopy:(Family Hx of colon polyps) 3 years ago - repeat at 5 yr  Hepatitis C Screening (birth year 03-1964): Never  Lung Cancer Screening:N/A  ?  Women  DXA: N/A  Mammogram:04/18/15  Pap Smear: Total hysterectomy  ?  Father with AAA  ?  Vaccines:  Tetanus: Due  Pneumonia:Pneumo 23 given 01/03/15, previously received Prevnar - Complete  Flu:03/18/16      Past Medical History:   Diagnosis Date   . Anemia 1982   . 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 Asthma 1961[birth year]03[birth year]09    seasonal asthma, diagnosed as an infant   . Bronchitis     Patient has had several times   . Cough    . GERD (gastroesophageal reflux disease) 2007   . Insomnia 2000   . Miscarriage     2x   . Pneumonia     Patient has had several times   . Vertigo 2008       Past Surgical History:   Procedure Laterality Date   . HYSTERECTOMY  07/2003   . LASIK Bilateral 2007    2x   . MOUTH  SURGERY  2005    Had to front teeth pulled and replaced with implants   . OOPHORECTOMY Right 07/2003       Allergies   Allergen Reactions   . Citrus Bioflavonoids  [Bioflavonoids] Other (See Comments)   . Egg Derived Other (See Comments)   . Vicodin [Hydrocodone-Acetaminophen] Nausea And Vomiting       Outpatient Prescriptions Marked as Taking for the 06/18/16 encounter (Office Visit) with Aleda Grana, FNP-C   Medication Sig Dispense Refill   . albuterol (VENTOLIN HFA) 90 mcg/actuation inhaler Inhale 2 puffs into the lungs every 4 hours as needed for Wheezing.     . beclomethasone (QVAR) 80 mcg/actuation inhaler Inhale 1 puff into the lungs 2 times daily.      Marland Kitchen BLACK COHOSH ORAL Take by mouth daily.     Marland Kitchen CALCIUM-MAGNESIUM ORAL Take by mouth daily.     . cetirizine (ZYRTEC) 10 mg Cap Take by mouth as needed.      Graciella Freer MED daily. Med Name: Thyroid Energy- once daily    Bladderwrack- once daily   Garlinase 1 per day  Grapefruit seed extract 2 per day  Pay d'arco 500mg  2 per day  DGL 2 3 times a day     . cyanocobalamin, vitamin B-12, (VITAMIN B-12) 1,000 mcg Subl Place under the tongue daily.     Marland Kitchen DIMENHYDRINATE (DRAMAMINE ORAL) Take by mouth as needed.     . fluticasone (FLONASE) 50 mcg/actuation nasal spray 2 sprays by Each Nare route daily.      . IBUPROFEN (ADVIL ORAL) Take by mouth as needed.     . MULTIVITAMIN ORAL Take by mouth daily. Women's Multi         Social History     Social History Main Topics   . Smoking status: Never Smoker   . Smokeless tobacco: Never Used      Comment: No smoke exposure   . Alcohol use No   . Drug use: No   . Sexual activity: No     Social History   . Marital status: Married     Spouse  name: Nadine Counts   . Number of children: 5   . Years of education: 24     Other Topics Concern   . Caffeine Concern Yes     1-2 cups of coffee daily   . Exercise Yes     walking the dogs or goes to the gym  5-6 days weekly       Family History   Problem Relation Age of Onset   .  Cervical cancer Mother    . Aortic aneurysm Father    . Colon polyps Father    . Parkinsonism Father    . Alcohol abuse Sister    . No Known Health Problems Brother    . No Known Health Problems Daughter    . No Known Health Problems Son    . Alzheimer's disease Maternal Grandmother    . Diabetes Paternal Grandfather    . No Known Health Problems Son    . No Known Health Problems Daughter    . No Known Health Problems Daughter    . Breast cancer Neg Hx    . Ovarian cancer Neg Hx        Health Maintenance       Date Due Completion Dates    CERVICAL CANCER SCREENING 07/13/1981 ---    COLORECTAL CANCER SCREENING 07/13/2010 ---    BREAST CANCER SCREENING 04/17/2017 04/18/2015          The following portions of the patient's history were reviewed and updated as appropriate: allergies, current medications, family history, past medical history, past surgical history and problem list.    REVIEW OF SYSTEMS     Review of Systems   Constitutional: Positive for fatigue.   Respiratory: Negative for chest tightness and shortness of breath.    Cardiovascular: Negative for chest pain and palpitations.   Gastrointestinal: Negative for abdominal pain, constipation, diarrhea, nausea and vomiting.   Allergic/Immunologic: Positive for environmental allergies and food allergies.   Neurological: Positive for dizziness.   Psychiatric/Behavioral: Positive for sleep disturbance.       OBJECTIVE     BP 130/80 (BP Location: Left arm, Patient Position: Sitting)   Pulse 83   Resp 14   Ht 5' 7 (1.702 m)   Wt 265 lb (120.2 kg)   SpO2 98%   BMI 41.50 kg/m?   BP Readings from Last 3 Encounters:   06/18/16 130/80   06/17/16 125/73   06/17/16 143/87     Wt Readings from Last 3 Encounters:   06/18/16 265 lb (120.2 kg)   06/17/16 263 lb 6.4 oz (119.5 kg)   06/17/16 259 lb (117.5 kg)     PF Readings from Last 3 Encounters:   No data found for PF     Body mass index is 41.5 kg/m?Marland Kitchen    No results found for this visit on 06/18/16 (from the past 48  hour(s)).     PHYSICAL EXAM     Physical Exam   Constitutional: She is oriented to person, place, and time and well-developed, well-nourished, and in no distress.   Cardiovascular: Normal rate, regular rhythm and normal heart sounds.    Pulmonary/Chest: Effort normal and breath sounds normal.   Neurological: She is alert and oriented to person, place, and time.   Skin: Skin is warm, dry and intact.   Psychiatric: Mood and affect normal.   Vitals reviewed.    ----------------------------------------------------------------------------------------------------------------------    This note was transcribed using speech recognition software. Please contact us for clarification if  any questions arise relating to the wording of this document.

## 2016-06-18 NOTE — Assessment & Plan Note (Signed)
She will follow-up with Dr. fear for the VNG testing. Considering Meniere's disease as her diagnosis. She has cut down her salt intake as advised.

## 2016-06-18 NOTE — Assessment & Plan Note (Addendum)
No improvement. She believes her weight and deconditioning are the main reason. She is starting a gluten free diet and is hopeful this will help with weight loss. She continues to supplement B12 and vitamin D.  -I have reviewed benefits of a Paleo or Ketogenic diet in weight loss and autoimmune processes. She is also referred to Dr. Loraine Leriche Hymen's web site on functional medicine.  -I have praised her efforts at focusing on lifestyle interventions to treat her fatigue. I would like her to return if her symptoms worsen, she experiences new symptoms, or she has difficulty making these changes.

## 2016-06-19 NOTE — Telephone Encounter (Signed)
-----   Message from Aleda Grana, FNP-C sent at 06/18/2016  1:56 PM PST -----  I have reviewed the patient's mammogram.  It is negative. No sign of malignancy. Please call the patient to inform her. Thank you.

## 2016-06-19 NOTE — Telephone Encounter (Signed)
Spoke to patient and informed her that Marylene Land reviewed her Mammogram. It is negative and there is no sign of malignancy. Patient was thankful for the call. Did not have any questions. The call ended mutually

## 2016-06-24 NOTE — Telephone Encounter (Signed)
Patient called in stating that she saw the eye doctor and he suggested to have her A1C checked due to what he saw during her eye exam.  Patient is requesting an A1C lab be ordered.  Please call back at [phone]  Okay to leave a detailed message and ok to laeve message with Nadine Counts her husband.

## 2016-06-24 NOTE — Telephone Encounter (Signed)
-----   Message from Shafer L. 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 He suggested I get my A1C checked since I am so young to have these types of cateriacts in both eyes and not be a diabetic.

## 2016-06-24 NOTE — Telephone Encounter (Signed)
Spoke to patient informed her that we had gotten her message and request for A1C. I informed her that we have ordered an A1C she can have done at her earliest convenience. She states that she will have it done on Monday when the weather is better. She did not have any questions or concerns. The call ended mutually

## 2016-06-30 ENCOUNTER — Other Ambulatory Visit: Admit: 2016-06-30 | Discharge: 2016-06-30 | Payer: PRIVATE HEALTH INSURANCE | Primary: MD

## 2016-06-30 DIAGNOSIS — R938 Abnormal findings on diagnostic imaging of other specified body structures: Secondary | ICD-10-CM

## 2016-06-30 LAB — GLYCO-HEMOGLOBIN A1C
Estimated Average Glucose: 108 mg/dL
Glycohemoglobin (A1c): 5.4 % (ref 4.3–6.1)

## 2016-06-30 NOTE — Progress Notes (Signed)
Hello Claudia Soto,    I have reviewed your results.    Your A1C is a 3 month average of your daily blood sugar. Optimal range is less than 5.5%. A result of 6.5% and greater is a diagnosis of diabetes.  Your A1C is in optimal range, so the changes noted in your eye are unlikely to be related.   You may talk to your eye doctor about other potential causes, but was a good idea to rule out diabetes.    Please call the clinic, send a message, or schedule an office visit if you have any questions or needs to address.    Lanney Gins, FNP DNP  [facility]

## 2016-07-01 DIAGNOSIS — Z23 Encounter for immunization: Secondary | ICD-10-CM | POA: Diagnosis not present

## 2016-07-07 ENCOUNTER — Inpatient Hospital Stay: Admit: 2016-07-07 | Payer: PRIVATE HEALTH INSURANCE | Attending: Medical | Primary: MD

## 2016-07-07 DIAGNOSIS — R42 Dizziness and giddiness: Secondary | ICD-10-CM

## 2016-07-07 MED ORDER — gadobenate dimeglumine (MULTIHANCE) injection 20 mL
529 | Freq: Once | INTRAVENOUS | Status: AC
Start: 2016-07-07 — End: 2016-07-07
  Administered 2016-07-07: 18:00:00 529 mL via INTRAVENOUS

## 2016-07-07 NOTE — Telephone Encounter (Signed)
-----   Message from Claudia Soto, [place] sent at 07/07/2016 10:54 AM PST -----  Please inform patient that her brain MRI was normal.

## 2016-07-07 NOTE — Telephone Encounter (Signed)
lmom to call back to go over results.

## 2016-07-08 NOTE — Telephone Encounter (Signed)
-----   Message from Jonathan Miller, PA sent at 07/07/2016 10:54 AM PST -----  Please inform patient that her brain MRI was normal.

## 2016-07-08 NOTE — Telephone Encounter (Signed)
Patient returned my call, I informed her of the normal MRI. I confirmed her appointment with Dr. Harriette Ohara on 08/20/16.

## 2016-07-09 NOTE — Telephone Encounter (Signed)
Please inform patient that her VNG demonstrated a weakness in her right ear balance system. Options are we could proceed with having her meet with a vestibular therapist or she can wait to discuss this at her next appointment with Dr. Harriette Ohara.

## 2016-07-09 NOTE — Telephone Encounter (Signed)
I spoke to the patient's husband, Nadine Counts, and let him know the VNG results. He said he would discuss it with the patient and have her call or wait until follow up with Dr. Harriette Ohara.

## 2016-08-13 DIAGNOSIS — J0100 Acute maxillary sinusitis, unspecified: Secondary | ICD-10-CM | POA: Diagnosis not present

## 2016-08-20 ENCOUNTER — Ambulatory Visit: Admit: 2016-08-20 | Payer: PRIVATE HEALTH INSURANCE | Attending: MD | Primary: MD

## 2016-08-20 DIAGNOSIS — R42 Dizziness and giddiness: Secondary | ICD-10-CM

## 2016-08-20 NOTE — Progress Notes (Signed)
Chief Complaint   Patient presents with   . Dizziness     Reck dizziness     HISTORY OF PRESENT ILLNESS   Patient is a 56 y.o. female referred by Aleda Grana, FNP who is here for evaluation of spinning sensation that began approximately 2009. Symptoms are have been worse since April 2017 and are worsened. The episodes are intermittent. Symptoms seem to be triggered by no identified factor and happens without warning. During the episodes patient denies any hearing loss or tinnitus fluctuations in None ear(s).  The vertigo lasts for several hours, but it takes up to 48 hours for all symptoms to resolve. The episodes are unpredictable, but she has had 4-5 episodes in the last 9 months. She does relate that the episodes started sometime after she had 2 severe right ear infections about 5 years ago, this is also when she noted to onset of right sided tinnitus.  ?  The onset of symptoms may have been associated with other cause of ear infections. Patient has not noticed any relationship between the dizziness and eating certain foods.Patient does not use cotton swabs or other object in ears.  ?  Additional associated symptoms include: weakness, uncoordination, nausea, vomiting, fatigue and sweats.  She also describes right sided tinnitus, and aural pressure, but this is present constantly, and she has not noticed if it increases during the vertigo.  Patient specifically denies: unusual headache, vision changes, passing out, numbness/tingling, chest pain, fever, shortness of breath, ear pain, ear drainage and anxiety.  She does have a history of Migraine headaches but only during pregnancy, and states that she does have frequent non-migrainous headaches and neck pain.  She specifically denies diplopia, sensory changes, or any history of a stroke.    ?  Patient does not have a past medical history of any neurological disorder, anemia, autoimmune disorder, prolonged loud noise exposure, or ear  disorder/surgery.  ??  Patient states that she has been getting intermittent vertigo symptoms (spinning sensations) over the past 9 months. She states that she has had 4-5 of these episodes during that time. The spinning sensation that can occur without warning and without any identifiable trigger. Position changes do not seem to bring it on and they can happen even when she is sitting still. The vertigo episodes will typically last several hours and have lasted as long as 2 days. She usually takes Dramamine when they occur and will sit in a reclining chair with her eyes closed until it passes. She does not notice any hearing loss fluctuations but she does have constant tinnitus in her right ear and occasionally she will feel fullness in her right ear when lying on her right side. She has no significant ear history otherwise she had severe infection about 9 or 10 years ago.     She states that she is starting to notice triggers, mainly stress and lack of sleep, but no diet or positional triggers.  She had her MRI and VNG, both of which are available in the chart for review.     Past Medical History:   Diagnosis Date   . Anemia 1982   . 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 Asthma September 23, 1960    seasonal asthma, diagnosed as an infant   . Bronchitis     Patient has had several times   . Cough    . GERD (gastroesophageal reflux disease) 2007   . Insomnia 2000   . Miscarriage  2x   . Pneumonia     Patient has had several times   . Vertigo 2008     Past Surgical History:   Procedure Laterality Date   . HYSTERECTOMY  07/2003   . LASIK Bilateral 2007    2x   . MOUTH SURGERY  2005    Had to front teeth pulled and replaced with implants   . OOPHORECTOMY Right 07/2003     Family History   Problem Relation Age of Onset   . Cervical cancer Mother    . Aortic aneurysm Father    . Colon polyps Father    . Parkinsonism Father    . Alcohol abuse Sister    . No Known Health Problems Brother    . No Known Health Problems Daughter    .  No Known Health Problems Son    . Alzheimer's disease Maternal Grandmother    . Diabetes Paternal Grandfather    . No Known Health Problems Son    . No Known Health Problems Daughter    . No Known Health Problems Daughter    . Breast cancer Neg Hx    . Ovarian cancer Neg Hx      Allergies   Allergen Reactions   . Citrus Bioflavonoids  [Bioflavonoids] Other (See Comments)   . Egg Derived Other (See Comments)   . Vicodin [Hydrocodone-Acetaminophen] Nausea And Vomiting     No outpatient prescriptions have been marked as taking for the 08/20/16 encounter (Office Visit) with Maia Plan, MD.     Social History     Social History Main Topics   . Smoking status: Never Smoker   . Smokeless tobacco: Never Used      Comment: No smoke exposure   . Alcohol use No   . Drug use: No   . Sexual activity: No     Social History   . Marital status: Married     Spouse name: Nadine Counts   . Number of children: 5   . Years of education: 68     Other Topics Concern   . Caffeine Concern Yes     1-2 cups of coffee daily   . Exercise Yes     walking the dogs or goes to the gym  5-6 days weekly     History   Smoking Status   . Never Smoker   Smokeless Tobacco   . Never Used     Comment: No smoke exposure     REVIEW OF SYSTEMS   Review of Systems   HENT: Positive for ear pain, hearing loss and tinnitus.    Neurological: Positive for dizziness.     PHYSICAL EXAM     Body mass index is 40.72 kg/m?Marland Kitchen  Vitals:    08/20/16 0902   BP: 142/88   BP Location: Left arm   Patient Position: Sitting   Temp: 36.2 ?C (97.2 ?F)   TempSrc: Temporal   Weight: 260 lb (117.9 kg)   Height: 5' 7 (1.702 m)     PHYSICAL EXAMINATION:    CONSTITUTIONAL:  General Appearance: well nourished, well-developed, alert, oriented, in no acute distress, cooperative with examination  Communication Ability / Voice Quality : communication ability normal, voice quality normal, English     FACE:  Inspection : normal appearance, no lesions present, no evidence of trauma, jaw position normal    Palpation : frontoethmoidal sinus nontender, maxillary sinuses nontender to palpation, no masses present   Facial Strength : facial motion symmetric,  normal eye closure strength bilaterally   Parotid Glands : no tenderness on palpation, no swelling present, no masses present     EARS:  Hearing : hearing to conversational voice intact   External Ears : auricles anatomically normal bilaterally, no auricle lesions present, no auricle tenderness to palpation present   External Auditory Canals: ear canals with no significant cerumen, external auditory canals have normal appearance, no external auditory canal lesions present  Otoscopic/Microscopic Exam :   Right ear: tympanic membranes appear normal, no tympanic membrane lesions present, no tympanic membrane perforations present, no fluid present behind tympanic membranes  Left ear: tympanic membranes appear normal, no tympanic membrane lesions present, no tympanic membrane perforations present, no fluid present behind tympanic membranes  Vestibular System : Normal . Rhomberg, headthrust, and Hallpike testing is negative.  There is no spontaneous nystagmus    NOSE / NASOPHARYNX:  External Nose : appearance normal, no tenderness on palpation, no significant nasal discharge present, no lesions, no evidence of trauma, nostrils patent without discharge   Intranasal Exam : nasal mucosa pink, moist and within normal limits, vestibule within normal limits, inferior turbinates appear normal, middle turbinates appear normal, nasal septum relatively midline, nasal discharge is clear and minimal, no polyposis seen     ORAL CAVITY / OROPHARYNX:  Lips : upper and lower lips pink, moist, and normal appearing.   Teeth : dentition within normal limits for age   Gums : gingivae healthy   Oral Mucosa : oral mucosa pink and moist with no lesions present   Floor of Mouth : floor of mouth within normal limits, salivary ducts appear patent   Tongue : tongue moist, midline, with symmetrical  movements with no lesions seen.   Palate : soft and hard palates within normal limits   Oropharynx : appearance within normal limits, tonsils normal in appearance, peritonsillar regions within normal limits     NECK:  Inspection and Palpation : appearance normal, no masses or tenderness on palpation; trachea midline and freely movable   Thyroid : size of gland normal, no tenderness, nodules or mass present on palpation, position midline   Submandibular Glands : normal size, nontender to palpation   Lymph Nodes : no lymphadenopathy present     CHEST / RESPIRATORY:  Respiratory Effort : breathing is symmetrical and unlabored   Auscultation of Lungs : normal breath sounds     NEUROLOGICAL / PSYCHIATRIC:  Orientation : oriented to time, place and person   Mood and Affect : mood normal, affect appropriate   Cranial Nerves : CN II-XII appear intact   Gait : gait normal    RESULTS     Pertinent Imaging:  MRI BRAIN W WO CONTRAST   07/07/2016 10:15 AM?  PROVIDED CLINICAL INDICATIONS:  dizziness Dizziness and giddiness?  COMPARISON:  None.?  TECHNIQUE:  Multiplanar multisequence pre and postcontrast IAC protocol brain MRI.  ?  FINDINGS:?  No diffusion restriction. Normal brain parenchymal signal. No extra-axial fluid. No brain parenchymal lesion. No abnormal enhancement postcontrast. Pituitary, corpus callosum, cerebellar tonsils and region of the pineal are normal. Globes and orbits within normal limits. No fluid signal in the paranasal sinuses or mastoid air cells.  ?  Dedicated IAC sequences show normal course of the 7th and 8th nerves through the internal auditory canals. Bilateral cochleas and semicircular canals are normal. No cerebellopontine angle mass. No mass or abnormal enhancement in the   internal auditory canals.?  ?  IMPRESSION:?  No cerebellopontine angle or internal auditory  canal lesion. Normal brain MRI.  IN OFFICE PROCEDURES     None    ASSESSMENT       ICD-9-CM ICD-10-CM    1. Dizziness 780.4 R42    2.  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 Headache disorder 784.0 R51    4. No passive smoke exposure V49.89 Z78.9         PLAN     Patient is seen today for follow up of dizziness.  I reviewed her VNG and MRI results with her in full detail.  She does have some mild peripheral right vestibular weakness on calorics, but otherwise testing is all normal.  I do recommend a referral to a Neurologist to evaluate headaches further. We have discussed possibility of Migraine asociated vertigo, but she also has many of the signs of Meniere's disease.  Meniere's disease, however should eventually result in some progressive or intermittent hearing loss, and the diagnosis is questionable unless this eventually develops. Posterior vascular compromise, or cervical vertigo are also discussed, but she has not other vascular disease, does not have diabetes, and MRI revealed no abnormality.  An MRA could be considered if any posterior circulation symptoms develop.  Low sodium diet was discussed, and is recommended.  Meniere's Diet handout given to her today.  I would like to see her back in 6 months with an Audiogram at that time.     Patient instructions were provided and all questions answered.    Return in about 6 months (around 02/20/2017).    I, Maia Plan, MD, personally performed the services described in this documentation, as scribed by Malachy Chamber., CMA in my presence, and it is both accurate and complete.    *This note was dictated using voice recognition software. If you have any questions concerning the content, please call our office at [phone].*

## 2016-09-25 DIAGNOSIS — J0100 Acute maxillary sinusitis, unspecified: Secondary | ICD-10-CM | POA: Diagnosis not present

## 2016-11-12 ENCOUNTER — Encounter: Payer: PRIVATE HEALTH INSURANCE | Attending: MD | Primary: MD

## 2016-11-12 NOTE — Progress Notes (Deleted)
Claudia Soto is a 56 y.o. female.  Chief Complaint   Patient presents with   . Skin Lesion     chest         SUBJECTIVE   Claudia Soto is a 56 y.o. female who presents today for Skin Lesion (chest)  .     HISTORY OF PRESENT ILLNESS     HPI  Skin Lesions  Patient presents today for two moles that have changed color.  Patient states that they have turned black and one is bleeding.  The moles have been present for ***.    Health Maintenance Summary                CERVICAL CANCER SCREENING Overdue 07/13/1981     COLORECTAL CANCER SCREENING Overdue 07/13/2010     INFLUENZA VACCINE Next Due 01/31/2017      Done 03/18/2016 Imm Admin: Flu Quadrivalent Injectable (PF)    BREAST CANCER SCREENING Next Due 06/18/2018      Done 06/18/2016 MAMMO TOMO SCREENING BILATERAL DIGITAL W CAD     Done 04/18/2015 HM MAMMOGRAPHY          The following portions of the patient's history were reviewed and updated as appropriate: allergies, current medications, past family history, past medical history, past surgical histroy and problem list.    Past Medical History:   Diagnosis Date   . Anemia 1982   . 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 Asthma [birth year] 03[birth year] 1961    seasonal asthma, diagnosed as an infant   . Bronchitis     Patient has had several times   . Cough    . GERD (gastroesophageal reflux disease) 2007   . Insomnia 2000   . Miscarriage     2x   . Pneumonia     Patient has had several times   . Vertigo 2008       Past Surgical History:   Procedure Laterality Date   . HYSTERECTOMY  07/2003   . LASIK Bilateral 2007    2x   . MOUTH SURGERY  2005    Had to front teeth pulled and replaced with implants   . OOPHORECTOMY Right 07/2003       Family History   Problem Relation Age of Onset   . Cervical cancer Mother    . Aortic aneurysm Father    . Colon polyps Father    . Parkinsonism Father    . Alcohol abuse Sister    . No Known Health Problems Brother    . No Known Health Problems Daughter    . No Known Health Problems Son    . Alzheimer's  disease Maternal Grandmother    . Diabetes Paternal Grandfather    . No Known Health Problems Son    . No Known Health Problems Daughter    . No Known Health Problems Daughter    . Breast cancer Neg Hx    . Ovarian cancer Neg Hx        Social History     Social History Main Topics   . Smoking status: Never Smoker   . Smokeless tobacco: Never Used      Comment: No smoke exposure   . Alcohol use No   . Drug use: No   . Sexual activity: No     Social History   . Marital status: Married     Spouse name: Nadine Counts   . Number of children: 5   . Years  of education: 31     Other Topics Concern   . Caffeine Concern Yes     1-2 cups of coffee daily   . Exercise Yes     walking the dogs or goes to the gym  5-6 days weekly       REVIEW OF SYSTEMS     Review of Systems  Clinically relevant ROS found in HPI.       OBJECTIVE     There were no vitals taken for this visit.  Outpatient Encounter Prescriptions as of 11/12/2016   Medication Sig Dispense Refill   . albuterol (VENTOLIN HFA) 90 mcg/actuation inhaler Inhale 2 puffs into the lungs every 4 hours as needed for Wheezing.     Marland Kitchen BLACK COHOSH ORAL Take by mouth daily.     Marland Kitchen CALCIUM-MAGNESIUM ORAL Take by mouth daily.     . cetirizine (ZYRTEC) 10 mg Cap Take by mouth as needed.      Graciella Freer MED daily. Med Name: Thyroid Energy- once daily    Bladderwrack- once daily   Garlinase 1 per day  Grapefruit seed extract 2 per day  Pay d'arco 500mg  2 per day  DGL 2 3 times a day     . cyanocobalamin, vitamin B-12, (VITAMIN B-12) 1,000 mcg Subl Place under the tongue daily.     Marland Kitchen DIMENHYDRINATE (DRAMAMINE ORAL) Take by mouth as needed.     . fluticasone (FLONASE) 50 mcg/actuation nasal spray 2 sprays by Each Nare route daily.      . IBUPROFEN (ADVIL ORAL) Take by mouth as needed.     . MULTIVITAMIN ORAL Take by mouth daily. Women's Multi       No facility-administered encounter medications on file as of 11/12/2016.      There is no height or weight on file to calculate BMI.       PHYSICAL  EXAM     Physical Exam    ASSESSMENT & PLAN     There are no diagnoses linked to this encounter.    No Follow-up on file.    There are no discontinued medications.    There are no Patient Instructions on file for this visit.  ----------------------------------------------------------------------------------------------------------------------    We had a thoughtful and careful discussion regarding the issues today. All questions were answered. We educated and reassured. We encouraged followup as needed.     This note was transcribed using speech recognition software. Please contact us for clarification if any questions arise relating to the wording of this document.    I, Dr. Gardiner Sleeper, personally performed the services described in this documentation, as scribed by *** in my presence, and it is both accurate and complete.

## 2017-02-25 ENCOUNTER — Encounter: Payer: PRIVATE HEALTH INSURANCE | Primary: MD

## 2017-02-25 ENCOUNTER — Encounter: Payer: PRIVATE HEALTH INSURANCE | Attending: MD | Primary: MD

## 2017-02-26 ENCOUNTER — Encounter: Payer: PRIVATE HEALTH INSURANCE | Attending: MD | Primary: MD

## 2017-02-27 DIAGNOSIS — Z23 Encounter for immunization: Secondary | ICD-10-CM | POA: Diagnosis not present

## 2017-02-27 NOTE — Telephone Encounter (Signed)
LMOM asking patient to return my call.  She cancelled her appt with Dr. Harriette Ohara via my chart, but the audiology appt did not get cancelled. In evaluating her chart she cancelled all her future appt's via my chart.  I did cancel the audiology appt and just need to find out if she is wanting to reschedule or not.  She also cancelled the appt with neurology and I would like to find out if she is wanting to reschedule or not.   SE

## 2017-03-02 NOTE — Telephone Encounter (Signed)
TC with patient and she states that she did cancel all of her appts because her insurance, [employer], told her that some doctors through [facility] are not covered.   I gave her the [facility] number to call and discuss further, because it is my understanding that [facility] is contracted with [employer].  She is going to call them and discuss this further and the let me know if she would like to restart her referrals.  SE

## 2017-03-11 ENCOUNTER — Encounter: Payer: PRIVATE HEALTH INSURANCE | Attending: MD | Primary: MD

## 2017-03-11 ENCOUNTER — Encounter: Payer: PRIVATE HEALTH INSURANCE | Primary: MD

## 2017-05-13 DIAGNOSIS — J0100 Acute maxillary sinusitis, unspecified: Secondary | ICD-10-CM | POA: Diagnosis not present

## 2017-05-13 DIAGNOSIS — J209 Acute bronchitis, unspecified: Secondary | ICD-10-CM | POA: Diagnosis not present

## 2017-05-22 DIAGNOSIS — J0100 Acute maxillary sinusitis, unspecified: Secondary | ICD-10-CM | POA: Diagnosis not present

## 2017-06-23 ENCOUNTER — Encounter: Payer: PRIVATE HEALTH INSURANCE | Attending: Family | Primary: MD

## 2017-07-01 DIAGNOSIS — J0100 Acute maxillary sinusitis, unspecified: Secondary | ICD-10-CM | POA: Diagnosis not present

## 2017-07-03 NOTE — Telephone Encounter (Signed)
Received ROI from [facility] requesting patient's medical records for Transfer of Care.     Processed, Printed and Faxed

## 2017-07-08 ENCOUNTER — Other Ambulatory Visit: Payer: Self-pay | Admitting: Physician Assistant

## 2017-07-08 DIAGNOSIS — Z139 Encounter for screening, unspecified: Secondary | ICD-10-CM

## 2017-07-29 ENCOUNTER — Ambulatory Visit
Admission: RE | Admit: 2017-07-29 | Discharge: 2017-07-29 | Disposition: A | Discharge status: Home / Self Care | Payer: BLUE CROSS/BLUE SHIELD | Source: Ambulatory Visit | Attending: Physician Assistant | Admitting: Physician Assistant

## 2017-07-29 DIAGNOSIS — Z1231 Encounter for screening mammogram for malignant neoplasm of breast: Secondary | ICD-10-CM | POA: Diagnosis not present

## 2017-07-29 DIAGNOSIS — Z139 Encounter for screening, unspecified: Secondary | ICD-10-CM

## 2017-10-03 DIAGNOSIS — L2089 Other atopic dermatitis: Secondary | ICD-10-CM | POA: Diagnosis not present

## 2017-12-14 DIAGNOSIS — Z1339 Encounter for screening examination for other mental health and behavioral disorders: Secondary | ICD-10-CM | POA: Diagnosis not present

## 2017-12-14 DIAGNOSIS — Z1331 Encounter for screening for depression: Secondary | ICD-10-CM | POA: Diagnosis not present

## 2017-12-14 DIAGNOSIS — F418 Other specified anxiety disorders: Secondary | ICD-10-CM | POA: Diagnosis not present

## 2017-12-14 DIAGNOSIS — F5102 Adjustment insomnia: Secondary | ICD-10-CM | POA: Diagnosis not present

## 2018-01-12 DIAGNOSIS — Z6823 Body mass index (BMI) 23.0-23.9, adult: Secondary | ICD-10-CM | POA: Diagnosis not present

## 2018-01-12 DIAGNOSIS — F418 Other specified anxiety disorders: Secondary | ICD-10-CM | POA: Diagnosis not present

## 2018-01-12 DIAGNOSIS — F5102 Adjustment insomnia: Secondary | ICD-10-CM | POA: Diagnosis not present

## 2018-02-09 DIAGNOSIS — Z0001 Encounter for general adult medical examination with abnormal findings: Secondary | ICD-10-CM | POA: Diagnosis not present

## 2018-02-09 DIAGNOSIS — Z136 Encounter for screening for cardiovascular disorders: Secondary | ICD-10-CM | POA: Diagnosis not present

## 2018-02-09 DIAGNOSIS — Z124 Encounter for screening for malignant neoplasm of cervix: Secondary | ICD-10-CM | POA: Diagnosis not present

## 2018-02-09 DIAGNOSIS — Z1159 Encounter for screening for other viral diseases: Secondary | ICD-10-CM | POA: Diagnosis not present

## 2018-02-10 ENCOUNTER — Telehealth: Payer: Self-pay | Admitting: Gastroenterology

## 2018-02-10 NOTE — Telephone Encounter (Signed)
Can you please get her records from [place] tomorrow And let me know Thanks

## 2018-02-10 NOTE — Telephone Encounter (Signed)
Pt calling to see when she is due for next colon. Last one done 11/18/2010. Please advise.

## 2018-02-11 NOTE — Telephone Encounter (Signed)
These results are on your desk.

## 2018-03-23 DIAGNOSIS — Z23 Encounter for immunization: Secondary | ICD-10-CM | POA: Diagnosis not present

## 2018-04-16 ENCOUNTER — Encounter: Payer: Self-pay | Admitting: Family Medicine

## 2018-06-10 DIAGNOSIS — R200 Anesthesia of skin: Secondary | ICD-10-CM | POA: Diagnosis not present

## 2018-06-10 DIAGNOSIS — R202 Paresthesia of skin: Secondary | ICD-10-CM | POA: Diagnosis not present

## 2018-06-10 DIAGNOSIS — R42 Dizziness and giddiness: Secondary | ICD-10-CM | POA: Diagnosis not present

## 2018-06-10 DIAGNOSIS — F5102 Adjustment insomnia: Secondary | ICD-10-CM | POA: Diagnosis not present

## 2018-07-01 DIAGNOSIS — R51 Headache: Secondary | ICD-10-CM | POA: Diagnosis not present

## 2018-07-30 DIAGNOSIS — R51 Headache: Secondary | ICD-10-CM | POA: Diagnosis not present

## 2018-07-30 DIAGNOSIS — R202 Paresthesia of skin: Secondary | ICD-10-CM | POA: Diagnosis not present

## 2018-09-22 ENCOUNTER — Telehealth: Payer: Self-pay | Admitting: Neurology

## 2018-09-22 NOTE — Telephone Encounter (Signed)
I called and spoke with the patient regarding changing their apt to a VV due to the COVID-19.  I explained to the patient that we would file their insurance and they gave consent. I also walked through the steps of downloading the app, and starting the virtual meeting, after confirming the patient had access to a smart phone with a working camera and microphone access.   Meeting number: [phone] [id number]: [id number] Host key: [id number]

## 2018-09-28 ENCOUNTER — Encounter: Payer: Self-pay | Admitting: Neurology

## 2018-09-28 NOTE — Telephone Encounter (Signed)
Spoke with pt. Confirmed pt using 2 identifiers. Updated pt's chart including PMH, meds, social history, etc. She has the email. She will download webex on her phone. She verbalized appreciation for the call. Pt encouraged to call if she has trouble joining meeting at 7:30 for her appt.

## 2018-09-30 ENCOUNTER — Ambulatory Visit: Payer: BLUE CROSS/BLUE SHIELD | Admitting: Neurology

## 2018-09-30 ENCOUNTER — Encounter: Payer: Self-pay | Admitting: Neurology

## 2018-09-30 ENCOUNTER — Other Ambulatory Visit: Payer: Self-pay

## 2018-09-30 ENCOUNTER — Telehealth: Payer: Self-pay | Admitting: Neurology

## 2018-09-30 VITALS — BP 150/86 | HR 86 | Ht 65.0 in | Wt 142.2 lb

## 2018-09-30 DIAGNOSIS — M6289 Other specified disorders of muscle: Secondary | ICD-10-CM

## 2018-09-30 DIAGNOSIS — H5461 Unqualified visual loss, right eye, normal vision left eye: Secondary | ICD-10-CM | POA: Diagnosis not present

## 2018-09-30 DIAGNOSIS — R29898 Other symptoms and signs involving the musculoskeletal system: Secondary | ICD-10-CM | POA: Diagnosis not present

## 2018-09-30 DIAGNOSIS — R51 Headache: Secondary | ICD-10-CM

## 2018-09-30 DIAGNOSIS — M316 Other giant cell arteritis: Secondary | ICD-10-CM

## 2018-09-30 DIAGNOSIS — R519 Headache, unspecified: Secondary | ICD-10-CM

## 2018-09-30 DIAGNOSIS — R200 Anesthesia of skin: Secondary | ICD-10-CM

## 2018-09-30 DIAGNOSIS — R258 Other abnormal involuntary movements: Secondary | ICD-10-CM

## 2018-09-30 MED ORDER — PREDNISONE 20 MG PO TABS: 80.0000 mg | ORAL_TABLET | Freq: Every day | ORAL | 0 refills | Status: AC

## 2018-09-30 MED ORDER — ALPRAZOLAM 0.25 MG PO TABS: ORAL_TABLET | ORAL | 0 refills | Status: AC

## 2018-09-30 MED ORDER — DIAZEPAM 10 MG PO TABS: ORAL_TABLET | ORAL | 1 refills | Status: DC

## 2018-09-30 NOTE — Patient Instructions (Signed)
Temporal Arteritis  Temporal arteritis is a condition that causes arteries to become inflamed. It usually affects arteries in your head and face, but arteries in any part of the body can become inflamed. The condition is also called giant cell arteritis.  Temporal arteritis can cause serious problems such as blindness. Early treatment can help prevent these problems. What are the causes? The cause of this condition is not known. What increases the risk? The following factors may make you more likely to develop this condition:  Being older than 50.  Being a woman.  Being Caucasian.  Being of [place], [place], [place], [place] or [place] ancestry.  Having a family history of the condition.  Having a certain condition that causes muscle pain and stiffness (polymyalgia rheumatica, PMR). What are the signs or symptoms? Some people with temporal arteritis have just one symptom, while others have several symptoms. Most symptoms are related to the head and face. These may include:  Headache.  Hard or swollen temples. This is common. Your temples are the areas on either side of your forehead. If your temples are swollen, it may hurt to touch them.  Pain when combing your hair or when laying your head down.  Pain in the jaw when chewing.  Pain in the throat or tongue.  Problems with your vision, such as sudden loss of vision in one eye, or seeing double. Other symptoms may include:  Fever.  Tiredness (fatigue).  A dry cough.  Pain in the hips and shoulders.  Pain in the arms during exercise.  Depression.  Weight loss. How is this diagnosed? This condition may be diagnosed based on:  Your symptoms.  Your medical history.  A physical exam.  Tests, including: ? Blood tests. ? A test in which a tissue sample is removed from an artery so it can be examined (biopsy). ? Imaging tests, such as an ultrasound or MRI. How is this treated? This condition may be  treated with:  A type of medicine to reduce inflammation (corticosteroid).  Medicines to weaken your immune system (immunosuppressants).  Other medicines to treat vision problems. You will need to see your health care provider while you are being treated. The medicines used to treat this condition can increase your risk of problems such as bone loss and diabetes. During follow-up visits, your health care provider will check for problems by:  Doing blood tests and bone density tests.  Checking your blood pressure and blood sugar. Follow these instructions at home: Medicines  Take over-the-counter and prescription medicines only as told by your health care provider.  Take any vitamins or supplements recommended by your health care provider. These may include vitamin D and calcium, which help keep your bones from becoming weak. Eating and drinking   Eat a heart-healthy diet. This may include: ? Eating high-fiber foods, such as fresh fruits and vegetables, whole grains, and beans. ? Eating heart-healthy fats (omega-3 fats), such as fish, flaxseed, and flaxseed oil. ? Limiting foods that are high in saturated fat and cholesterol, such as processed and fried foods, fatty meat, and full-fat dairy. ? Limiting how much salt (sodium) you eat.  Include calcium and vitamin D in your diet. Good sources of calcium and vitamin D include: ? Low-fat dairy products such as milk, yogurt, and cheese. ? Certain fish, such as fresh or canned salmon, tuna, and sardines. ? Products that have calcium and vitamin D added to them (fortified products), such as fortified cereals or juice. General instructions  Exercise. Talk with your health care provider about what exercises are okay for you. Exercises that increase your heart rate (aerobic exercise), such as walking, are often recommended. Aerobic exercise helps control your blood pressure and prevent bone loss.  Stay up to date on all vaccines as directed  by your health care provider.  Keep all follow-up visits as told by your health care provider. This is important. Contact a health care provider if:  Your symptoms get worse.  You develop signs of infection, such as fever, swelling, redness, warmth, and tenderness. Get help right away if:  You lose your vision.  Your pain does not go away, even after you take medicine.  You have chest pain.  You have trouble breathing.  One side of your face or body suddenly becomes weak or numb. These symptoms may represent a serious problem that is an emergency. Do not wait to see if the symptoms will go away. Get medical help right away. Call your local emergency services (911 in the [place] Do not drive yourself to the hospital. Summary  Temporal arteritis is a condition that causes arteries to become inflamed. It usually affects arteries in your head and face.  This condition can cause serious problems, such as blindness. Treatment can help prevent these problems.  Symptoms may include hard or tender temples, pain in your jaw when chewing, problems with your vision, or pain in your hips and shoulders.  Take over-the-counter and prescription medicines as told by your health care provider. This information is not intended to replace advice given to you by your health care provider. Make sure you discuss any questions you have with your health care provider. Document Released: 03/16/2009 Document Revised: 06/30/2017 Document Reviewed: 06/30/2017 Elsevier Interactive Patient Education  2019 Else[employer] Inc.  Migraine Headache  A migraine headache is a very strong throbbing pain on one side or both sides of your head. Migraines can also cause other symptoms. Talk with your doctor about what things may bring on (trigger) your migraine headaches. Follow these instructions at home: Medicines  Take over-the-counter and prescription medicines only as told by your doctor.  Do not drive or use heavy  machinery while taking prescription pain medicine.  To prevent or treat constipation while you are taking prescription pain medicine, your doctor may recommend that you: ? Drink enough fluid to keep your pee (urine) clear or pale yellow. ? Take over-the-counter or prescription medicines. ? Eat foods that are high in fiber. These include fresh fruits and vegetables, whole grains, and beans. ? Limit foods that are high in fat and processed sugars. These include fried and sweet foods. Lifestyle  Avoid alcohol.  Do not use any products that contain nicotine or tobacco, such as cigarettes and e-cigarettes. If you need help quitting, ask your doctor.  Get at least 8 hours of sleep every night.  Limit your stress. General instructions   Keep a journal to find out what may bring on your migraines. For example, write down: ? What you eat and drink. ? How much sleep you get. ? Any change in what you eat or drink. ? Any change in your medicines.  If you have a migraine: ? Avoid things that make your symptoms worse, such as bright lights. ? It may help to lie down in a dark, quiet room. ? Do not drive or use heavy machinery. ? Ask your doctor what activities are safe for you.  Keep all follow-up visits as told by your doctor. This  is important. Contact a doctor if:  You get a migraine that is different or worse than your usual migraines. Get help right away if:  Your migraine gets very bad.  You have a fever.  You have a stiff neck.  You have trouble seeing.  Your muscles feel weak or like you cannot control them.  You start to lose your balance a lot.  You start to have trouble walking.  You pass out (faint). This information is not intended to replace advice given to you by your health care provider. Make sure you discuss any questions you have with your health care provider. Document Released: 02/26/2008 Document Revised: 02/10/2018 Document Reviewed: 11/05/2015 Elsevier  Interactive Patient Education  2019 [employer].

## 2018-09-30 NOTE — Telephone Encounter (Signed)
[employer] Auth: [id number] (exp. 09/30/18 to 03/28/19) order faxed to [facility]. they will reach out to the pt to schedule

## 2018-09-30 NOTE — Addendum Note (Signed)
Addended by: Sarina Ill B on: 09/30/2018 12:16 PM   Modules accepted: Orders

## 2018-09-30 NOTE — Progress Notes (Addendum)
[id number]    Provider:  Dr Jaynee Eagles Requesting Provider: Cyndi Bender, PA-C Primary Care Provider:  Cyndi Bender, PA-C  CC:  headache  HPI:  Destiny Turner is a 58 y.o. female here as requested by Cyndi Bender, PA-C for right temporal headache.  No significant past medical history, osteopenia, anxiety and depression.  Normal BMI.  In December she had dizziness. On Gwynne Edinger. She has numbness on the right side of the face. Muscle relaxer did not help much. She have pain on chewing, has been treated for TMJ. Her arms are numb. The right side of her face is numb. She has a weird feeling on the right side of the face. It doesn't feel the same when she touches it. This is new, no hx of migraines. She has pain on the right side of the head, started around the top parietal area. Some days the pain Is dull. Most times is throbbing.  No light or sound sensitivity. No nausea or vomiting. +vision changes right eye. Her arms are numb and heavy. Heavy feelings in the arms. + dizziness. No room spinning currently. No personal or family history of autoimmune disorders, neuromuscular disorders, migraines.No other focal neurologic deficits, associated symptoms, inciting events or modifiable factors.  Reviewed notes, labs and imaging from outside physicians, which showed:  Reviewed notes from [facility].  Patient's had persistent headaches, right temple and jaw region mostly, she was treated for TMJ headache slightly improved but then worsening again.  Headache is continuous and is there when she wakes up.  She is also having pain at the base of the neck bilaterally numbness and tingling in the arms and hands intermittently as well, no weakness in the upper extremities, no blurry vision or double vision, no light or sound sensitivity, no nausea or vomiting.  Exam noted right-sided TMJ tenderness present.  Otherwise no deficits on neurologic or physical exam. Also  c/o dizziness.   TSH nml 1.9 B12 635 nml Cbc/cmp unremarkable.  Review of Systems: Patient complains of symptoms per HPI as well as the following symptoms: headache, vision changes, muscle pain, weakness, numbness. Pertinent negatives and positives per HPI. All others negative.   Social History   Socioeconomic History   Marital status: Married    Spouse name: Not on file   Number of children: Not on file   Years of education: 2 yrs of college   Highest education level: Some college, no degree  Occupational History   Not on file  Social Needs   Financial resource strain: Not on file   Food insecurity:    Worry: Not on file    Inability: Not on file   Transportation needs:    Medical: Not on file    Non-medical: Not on file  Tobacco Use   Smoking status: Never Smoker   Smokeless tobacco: Never Used  Substance and Sexual Activity   Alcohol use: No   Drug use: No   Sexual activity: Not on file  Lifestyle   Physical activity:    Days per week: Not on file    Minutes per session: Not on file   Stress: Not on file  Relationships   Social connections:    Talks on phone: Not on file    Gets together: Not on file    Attends religious service: Not on file    Active member of club or organization: Not on file    Attends meetings of clubs or organizations: Not  on file    Relationship status: Not on file   Intimate partner violence:    Fear of current or ex partner: Not on file    Emotionally abused: Not on file    Physically abused: Not on file    Forced sexual activity: Not on file  Other Topics Concern   Not on file  Social History Narrative   Lives at home with her husband, son & grandson   Adopted 2 children   Right handed   Caffeine: about 2 cups of coffee in the morning and 2 glasses unsweet tea daily; very rare soda    Family History  Problem Relation Age of Onset   Stroke Mother    Pulmonary Hypertension Mother    Arthritis Mother     Other Mother        back problems   Heart attack Father    Other Father        rheumatoid lung, back problems   Pneumonia Father        died of this    Stroke Brother    Migraines Neg Hx    Headache Neg Hx     Past Medical History:  Diagnosis Date   Depression    Early menopause    Endometriosis    Hypertension 07/09/2012   Episodic; no medications prescribed.   Infertility, female     There are no active problems to display for this patient.   Past Surgical History:  Procedure Laterality Date   LAPAROSCOPY     3x for endometriosis two in 19s and one in early 1990s   TONSILLECTOMY      Current Outpatient Medications  Medication Sig Dispense Refill   ALPRAZolam (XANAX) 0.25 MG tablet Take 1-2 tabs (0.9m-0.50mg) 30-60 minutes before procedure. May repeat if needed.Do not drive. 4 tablet 0   predniSONE (DELTASONE) 20 MG tablet Take 4 tablets (80 mg total) by mouth daily. 56 tablet 0   sertraline (ZOLOFT) 50 MG tablet Take 50 mg by mouth daily.     tiZANidine (ZANAFLEX) 4 MG tablet Take 2-4 mg by mouth 2 (two) times daily as needed for muscle spasms.     zolpidem (AMBIEN) 5 MG tablet Take 5 mg by mouth at bedtime.     No current facility-administered medications for this visit.     Allergies as of 09/30/2018 - Review Complete 09/30/2018  Allergen Reaction Noted   Sulfa antibiotics  07/09/2012    Vitals: BP (!) 150/86    Pulse 86    Ht '5\' 5"'  (1.651 m)    Wt 142 lb 3.2 oz (64.5 kg)    BMI 23.66 kg/m  Last Weight:  Wt Readings from Last 1 Encounters:  09/30/18 142 lb 3.2 oz (64.5 kg)   Last Height:   Ht Readings from Last 1 Encounters:  09/30/18 '5\' 5"'  (1.651 m)   Physical exam: Exam: Gen: NAD, conversant, well nourised, well groomed                     CV: RRR, no MRG. No Carotid Bruits. No peripheral edema, warm, nontender Eyes: Conjunctivae clear without exudates or hemorrhage  Neuro: Detailed Neurologic Exam  Speech:    Speech is  normal; fluent and spontaneous with normal comprehension.  Cognition:    The patient is oriented to person, place, and time;     recent and remote memory intact;     language fluent;     normal attention, concentration,  fund of knowledge Cranial Nerves:    The pupils are equal, round, and reactive to light. The fundi are normal and spontaneous venous pulsations are present. Visual fields are full to finger confrontation. Extraocular movements are intact.decreased sensation right side of face. The face is symmetric. The palate elevates in the midline. Hearing intact. Voice is normal. Shoulder shrug is normal. The tongue has normal motion without fasciculations.   Coordination:    Normal finger to nose and heel to shin. Normal rapid alternating movements.   Gait:    Heel-toe and tandem gait are normal.   Motor Observation:    No asymmetry, no atrophy, and no involuntary movements noted. Tone:    Normal muscle tone.    Posture:    Posture is normal. normal erect    Strength:    Proximal weakness      Sensation: intact to LT     Reflex Exam:  DTR's:    Deep tendon reflexes in the upper and lower extremities are brisk bilaterally.   Toes:    The toes are downgoing bilaterally.   Clonus:    Clonus is absent.    Assessment/Plan: This is a 58 year old patient with new onset intractable right temporoparietal headache. Onset acute in January, chronic course.  She has very concerning symptoms including vision changes of the right eye, right facial numbness, arm weakness, intractable headache.  Differential includes stroke, intraparenchymal mass or lesion, temporal arteritis.  She has no previous history of headaches or migraines or any family history that would be unlikely presentation for new onset migraines.  We need an emergent MRI of the brain with and without contrast, also labs ESR and CRP, will start patient on steroids and see if ESR CRP elevated and results of MRI of the  brain.  Also recommended patient to see ophthalmology to ensure no glaucoma or other issues with the globe.  Cannot rule out polymyalgia rheumatica given her other symptoms of arm weakness and heaviness of the shoulder girdle. Clonus on exam. Proximal weakness on exam.   Meds ordered this encounter  Medications   ALPRAZolam (XANAX) 0.25 MG tablet    Sig: Take 1-2 tabs (0.24m-0.50mg) 30-60 minutes before procedure. May repeat if needed.Do not drive.    Dispense:  4 tablet    Refill:  0   predniSONE (DELTASONE) 20 MG tablet    Sig: Take 4 tablets (80 mg total) by mouth daily.    Dispense:  56 tablet    Refill:  0   Orders Placed This Encounter  Procedures   MR BRAIN W WO CONTRAST   C-reactive protein   Sedimentation rate   BUN   Creatinine, Serum     Cc: CCyndi Bender PA-C,    ASarina Ill MD  [facility] [address] [id number] Phone [phone] [phone] A total of 120 minutes was spent face-to-face with this patient. Over half this time as spent on counseling patient on the  1. Right sided temporal headache   2. Temporal arteritis (HVictor   3. Bilateral arm weakness   4. Vision loss, right eye   5. New onset of headaches after age 58   6 Right facial numbness   7. Clonus   8. Proximal weakness of extremity    diagnosis and different diagnostic and therapeutic options, counseling and coordination of care, risks ans benefits of management, compliance, or risk factor reduction and education.

## 2018-10-01 LAB — CREATININE, SERUM
Creatinine, Ser: 0.71 mg/dL (ref 0.57–1.00)
GFR calc Af Amer: 109 mL/min/{1.73_m2} (ref >59)
GFR calc non Af Amer: 94 mL/min/{1.73_m2} (ref >59)

## 2018-10-01 LAB — BUN: BUN: 11 mg/dL (ref 6–24)

## 2018-10-01 LAB — C-REACTIVE PROTEIN: CRP: 2 mg/L (ref 0–10)

## 2018-10-01 LAB — SEDIMENTATION RATE: Sed Rate: 3 mm/hr (ref 0–40)

## 2018-10-02 ENCOUNTER — Telehealth: Payer: Self-pay | Admitting: Neurology

## 2018-10-02 NOTE — Telephone Encounter (Signed)
Esr and crp normal. Patient improved on steroids. MRi scheduled Thursday. Asked her to taper off over the next 4 days

## 2018-10-04 NOTE — Telephone Encounter (Signed)
Destiny Turner, can you call in the prescription please and ask them to mail it to her? You can call in the same exact prescription that we have to the same pharmacy and give the pharmacy patient's name. I think she will have to pay for it over the phone so ask her to call the pharmacy after you prescribe it. I think [facility] but you have the jar there thanks

## 2018-10-04 NOTE — Telephone Encounter (Signed)
Pt called in requesting numbing cream for her area they will administer her IV

## 2018-10-04 NOTE — Telephone Encounter (Signed)
I spoke with Gerald Stabs at the [facility] in [place]. The numbing cream that we use is 30 gram jar of Benzocaine 20%, Lidocaine 6%, Tetracaine 4%. One jar ordered per v.o. for pt with directions to "apply as directed". The will contact the patient to mail this to her with anticipated arrival by the 7th.

## 2018-10-04 NOTE — Telephone Encounter (Signed)
Noted thanks °

## 2018-10-04 NOTE — Telephone Encounter (Signed)
Just an Delena Serve with [facility] informed me the patient is scheduled for 10/07/18.

## 2018-10-07 DIAGNOSIS — R51 Headache: Secondary | ICD-10-CM | POA: Diagnosis not present

## 2018-10-11 ENCOUNTER — Telehealth: Payer: Self-pay | Admitting: Neurology

## 2018-10-11 NOTE — Telephone Encounter (Signed)
MRI report states No significant abnormality. Dr. Jaynee Eagles aware.

## 2018-10-11 NOTE — Telephone Encounter (Signed)
Spoke with patient and discussed her MRI is normal for her age, no significant abnormalities. Pt verbalized understanding and was very appreciative for the call.

## 2018-10-11 NOTE — Telephone Encounter (Signed)
Let her know MRI is normal for age, no significant abnormalities. She will be thrilled. Thank you

## 2018-10-11 NOTE — Telephone Encounter (Signed)
MRI report is on your desk. Destiny Turner was able to retrieve it.

## 2018-10-11 NOTE — Telephone Encounter (Signed)
Destiny Turner, Patient had her MRI last week at [facility]. Can I get a report asap? I can wait and review the CD at a later time but can we get a fax of the neuroradiology reading? thanks

## 2018-12-13 DIAGNOSIS — F5102 Adjustment insomnia: Secondary | ICD-10-CM | POA: Diagnosis not present

## 2018-12-13 DIAGNOSIS — F418 Other specified anxiety disorders: Secondary | ICD-10-CM | POA: Diagnosis not present

## 2018-12-13 DIAGNOSIS — R51 Headache: Secondary | ICD-10-CM | POA: Diagnosis not present

## 2018-12-13 DIAGNOSIS — Z6822 Body mass index (BMI) 22.0-22.9, adult: Secondary | ICD-10-CM | POA: Diagnosis not present

## 2019-02-15 DIAGNOSIS — Z23 Encounter for immunization: Secondary | ICD-10-CM | POA: Diagnosis not present

## 2019-07-12 DIAGNOSIS — Z1331 Encounter for screening for depression: Secondary | ICD-10-CM | POA: Diagnosis not present

## 2019-07-12 DIAGNOSIS — M8580 Other specified disorders of bone density and structure, unspecified site: Secondary | ICD-10-CM | POA: Diagnosis not present

## 2019-07-12 DIAGNOSIS — F5102 Adjustment insomnia: Secondary | ICD-10-CM | POA: Diagnosis not present

## 2019-07-12 DIAGNOSIS — R519 Headache, unspecified: Secondary | ICD-10-CM | POA: Diagnosis not present

## 2019-07-12 DIAGNOSIS — F418 Other specified anxiety disorders: Secondary | ICD-10-CM | POA: Diagnosis not present

## 2019-07-14 ENCOUNTER — Other Ambulatory Visit: Payer: Self-pay | Admitting: Physician Assistant

## 2019-07-14 DIAGNOSIS — M8580 Other specified disorders of bone density and structure, unspecified site: Secondary | ICD-10-CM

## 2019-07-14 DIAGNOSIS — Z1231 Encounter for screening mammogram for malignant neoplasm of breast: Secondary | ICD-10-CM

## 2019-09-14 DIAGNOSIS — M25562 Pain in left knee: Secondary | ICD-10-CM | POA: Diagnosis not present

## 2019-09-14 DIAGNOSIS — M25561 Pain in right knee: Secondary | ICD-10-CM | POA: Diagnosis not present

## 2019-09-14 DIAGNOSIS — Z6823 Body mass index (BMI) 23.0-23.9, adult: Secondary | ICD-10-CM | POA: Diagnosis not present

## 2019-09-15 ENCOUNTER — Other Ambulatory Visit: Payer: Self-pay | Admitting: Physician Assistant

## 2019-09-15 ENCOUNTER — Other Ambulatory Visit: Payer: Self-pay | Admitting: Internal Medicine

## 2019-09-15 ENCOUNTER — Ambulatory Visit
Admission: RE | Admit: 2019-09-15 | Discharge: 2019-09-15 | Disposition: A | Discharge status: Home / Self Care | Payer: BLUE CROSS/BLUE SHIELD | Source: Ambulatory Visit | Attending: Physician Assistant | Admitting: Physician Assistant

## 2019-09-15 ENCOUNTER — Other Ambulatory Visit: Payer: Self-pay

## 2019-09-15 DIAGNOSIS — M25562 Pain in left knee: Secondary | ICD-10-CM | POA: Diagnosis not present

## 2019-09-15 DIAGNOSIS — R52 Pain, unspecified: Secondary | ICD-10-CM

## 2019-09-15 DIAGNOSIS — M25561 Pain in right knee: Secondary | ICD-10-CM | POA: Diagnosis not present

## 2019-09-28 ENCOUNTER — Ambulatory Visit
Admission: RE | Admit: 2019-09-28 | Discharge: 2019-09-28 | Disposition: A | Discharge status: Home / Self Care | Payer: BLUE CROSS/BLUE SHIELD | Source: Ambulatory Visit | Attending: Physician Assistant | Admitting: Physician Assistant

## 2019-09-28 ENCOUNTER — Other Ambulatory Visit: Payer: Self-pay

## 2019-09-28 DIAGNOSIS — M8589 Other specified disorders of bone density and structure, multiple sites: Secondary | ICD-10-CM | POA: Diagnosis not present

## 2019-09-28 DIAGNOSIS — Z1231 Encounter for screening mammogram for malignant neoplasm of breast: Secondary | ICD-10-CM | POA: Diagnosis not present

## 2019-09-28 DIAGNOSIS — Z780 Asymptomatic menopausal state: Secondary | ICD-10-CM | POA: Diagnosis not present

## 2019-09-28 DIAGNOSIS — M8580 Other specified disorders of bone density and structure, unspecified site: Secondary | ICD-10-CM

## 2020-01-12 DIAGNOSIS — J019 Acute sinusitis, unspecified: Secondary | ICD-10-CM | POA: Diagnosis not present

## 2020-01-12 DIAGNOSIS — Z1159 Encounter for screening for other viral diseases: Secondary | ICD-10-CM | POA: Diagnosis not present

## 2020-01-30 DIAGNOSIS — R519 Headache, unspecified: Secondary | ICD-10-CM | POA: Diagnosis not present

## 2020-01-30 DIAGNOSIS — Z1331 Encounter for screening for depression: Secondary | ICD-10-CM | POA: Diagnosis not present

## 2020-01-30 DIAGNOSIS — M8580 Other specified disorders of bone density and structure, unspecified site: Secondary | ICD-10-CM | POA: Diagnosis not present

## 2020-01-30 DIAGNOSIS — F418 Other specified anxiety disorders: Secondary | ICD-10-CM | POA: Diagnosis not present

## 2020-01-30 DIAGNOSIS — F5102 Adjustment insomnia: Secondary | ICD-10-CM | POA: Diagnosis not present

## 2020-03-02 NOTE — Telephone Encounter (Signed)
Left Message to confirm sleep study appointment

## 2020-03-06 ENCOUNTER — Other Ambulatory Visit: Admit: 2020-03-06 | Discharge: 2020-03-06 | Payer: PRIVATE HEALTH INSURANCE | Primary: MD

## 2020-03-06 DIAGNOSIS — G4733 Obstructive sleep apnea (adult) (pediatric): Secondary | ICD-10-CM

## 2020-03-06 NOTE — Progress Notes (Signed)
HSAT QUESTIONNAIRE    PRE-HOME SLEEP STUDY QUESTIONS   HSAT PRE-HOME SLEEP STUDY QUESTIONNAIRE 03/13/2020   How much sleep did you get last night? Don't know-checking it makes me upset & I was sleeping less due to frustration   Is this shorter or longer than ususal? I wake up 3-7 times each night-each night is different   What position do you normally sleep in at home? Sides; Back   Did you have any naps today? No   Did you have any of the following today? Coffee; Soda   If yes, how much? soda-2, coffee 3 cups   If yes, when? throughout the day   Did you drink any alcoholic beverages today? No   At what time was your last meal? 6pm       MORNING QUESTIONS  HSAT MORNING QUESTIONNAIRE 03/13/2020   What time did you go to bed? 9:30ish   What time did you get up for the day? 4ish   How long did it feel like it took you to fall asleep last night? about normal amount of time   How long do you feel you slept last night? I wake up so much I don't have any idea how much I slept   How many times do you remember waking up last night? I didn't count but it was a lot   Do you have any physical complaints right now? I'm tired   Did you take any medications to help you sleep last night? Yes   If yes, what was taken? zyrtec, magnesium   Was your sleep last night: (No Data)   Do you remember any dreams from last night? Yes   The quality of my sleep last night was: The same as at home   Please rate your Sleep Technologist with (1 low and 5 very good): 5   Was the HSAT device easy to use? No

## 2020-03-06 NOTE — Progress Notes (Signed)
Patient is here to receive instructions for the use of the [facility] Sleep Study Unit (HSAT).     HSAT unit #30  was initialized with pertinent patient statistics and information.    Patient was guided through the application of all sensors.     Questions were answered and patient was shown the guide with written instructions and pictures that are contained within the HSAT unit case.     Instructions were given to call the Sleep Center for technical support and to call 911 in case of a medical emergency. Phone numbers are on the listed on the outside of the HSAT unit case.     Patient demonstrated appropriate understanding of how to apply all sensors and the instructions to return the HST unit to the Cardiopulmonary Department sometime during the day tomorrow.

## 2020-03-15 NOTE — Procedures (Signed)
[facility]    NAME:  Claudia Soto, Claudia Soto.  AGE:  59  DOB:  09-Dec-1960  HOSP NO:  [id number]  DATE OF SERVICE:  03/06/2020    HOME SLEEP APNEA TEST     HOME SLEEP APNEA TEST (HSAT) PROTOCOL: A home cardiorespiratory monitoring test   was conducted using a type III portable monitor, unattended.  A licensed sleep   technologist demonstrated proper hook-up for patient. The patient was given   written and verbal instructions. This included one channel of snoring using a   snore sensor, one channel of oxygen saturation and heart rate via pulse   oximeter, one channel of respiratory effort via respiratory inductive   plethysmography (RIP) belt and a position sensor.  Airflow was measured using a   pressure transducer nasal cannula. Apnea is defined as a cessation of airflow   for at least 10 seconds with at least a 30% reduction in thoraco-abdominal   movement or airflow as compared to baseline, and with at least a 4% oxygen   desaturation.  The study was manually scored.  Raw digital patient data is   stored for 10 years and is then deleted from archive storage.    RESULTS:  The patient was studied for 368 minutes of recording time with lights   out at 9:24 p.m., lights on at 3:32 a.m.  During this time, the patient   exhibited very severe sleep-disordered breathing with an apnea-hypopnea index of  65.2 events per hour.  These were all obstructive events and there was a total   of 382 with a mean duration of 25 seconds and some lasting up to 66 seconds.    The patient slept almost exclusively in the supine position.    Oximetry revealed an O2 saturation of 92%, lowest O2 recorded was 61%, and she   had a total of 63 minutes below 88% saturation and about 9 minutes below 80%   saturation.    Average heart rate was 67 beats per minute with a high of 106 and a low of 45.    DISCUSSION:  This is a 59 year old female who is referred to the sleep lab with   a history of a BMI of 43.7, daytime fatigue.  She is  noted on this study to have  very severe sleep-disordered breathing with an apnea-hypopnea index of 65.2 and   very significant oxygen desaturations.    Considering the severity of her sleep apnea, feel it would be important for her   to consider CPAP therapy, and this would be best be achieved by having her come   to the sleep lab and undergo an attended polysomnogram with CPAP titration and   possible oxygen therapy.    If there are additional concerns or questions, feel free to contact the sleep   lab.      Jesusita Oka Tyrone Apple, MD    DFF/clark  D:  03/15/2020  1:55 P   T:  03/15/2020  4:33 P   TID:  [id number]  RECEIPT:    [id number]  cc:  Edgar Frisk, MD

## 2020-03-28 NOTE — Telephone Encounter (Signed)
Left message re: scheduling sleep study appt / requested a call back to schedule

## 2020-04-04 DIAGNOSIS — Z23 Encounter for immunization: Secondary | ICD-10-CM | POA: Diagnosis not present

## 2020-04-19 ENCOUNTER — Other Ambulatory Visit: Admit: 2020-04-19 | Discharge: 2020-04-19 | Payer: PRIVATE HEALTH INSURANCE | Primary: MD

## 2020-04-19 ENCOUNTER — Other Ambulatory Visit: Admit: 2020-04-20 | Discharge: 2020-04-20 | Payer: PRIVATE HEALTH INSURANCE | Primary: MD

## 2020-04-19 DIAGNOSIS — Z1159 Encounter for screening for other viral diseases: Secondary | ICD-10-CM

## 2020-04-19 LAB — SARS-COV-2 (COVID-19), SCREENING/ASYMPTOMATIC UNEXPOSED: SARS-CoV-2 (COVID-19) by RT-PCR: NOT DETECTED

## 2020-04-19 NOTE — Procedures (Signed)
CPAP Titration Polysomnography  [facility]  Phone: [phone]      Patient Name: Claudia Soto   DOB: 08-13-1960   MRN: [id number]     Date of Study: 04/19/2020  Date of Interpretation: 04/28/2020     -- HISTORY/INDICATION:  The patient is a 59 y.o. female with previously diagnosed severe obstructive sleep apnea, presenting for PAP titration to identify optimal settings.    BMI Readings from Last 1 Encounters:   08/20/16 40.72 kg/m?    Wt Readings from Last 1 Encounters:   08/20/16 260 lb (117.9 kg)      -- TITRATION POLYSOMNOGRAM PROTOCOL: This study was scored according to AASM standards. A polysomnogram with CPAP and/or Bilevel-PAP treatment was conducted. This included 3 channels of electroencephalogram (EEG), 2 channels electrooculogram (EOG), 3 channels of electromyogram (EMG), 1 channel electrocardiogram (ECG), 1 channel oxygen saturation and heart rate via pulse oximeter, 2 channels respiratory effort via piezo trace sensors, audio is monitored, and video is recorded to monitor movement. Airflow and PAP pressures were measured at the PAP unit by transducer. Apnea is defined as a cessation of airflow for at least 10 seconds. Hypopnea is defined as an abnormal respiratory event lasting at least 10 seconds with at least a 30 percent reduction in thoracoabdominal movement or airflow as compared to baseline, and with at least a 4% oxygen desaturation. Raw digital patient data is stored for 10 years (minus video) and is then deleted from archive storage.        -- TITRATION RESULTS:  Total recording time was 524.6 minutes and total sleep time was 291.4 minutes. Sleep onset latency was prolonged, at 34.1 minutes. Sleep efficiency was markedly reduced at 56.3%. REM onset latency was unremarkable (71.5 minutes) and the total time spent in REM was unremarkable (21.6% of total sleep time).      The patient spent 0% of total sleep time in the supine position.      Air  hunger was reported at CPAP 5 cmH2O. CPAP was then titrated from 6 to 12 cmH2O with an in-line F&P filter after the air outlet, which reduces patient-experienced pressures by 1 cmH2O (per internal testing). Pressures as low as 8 cmH2O (with F&P filter) appeared effective in non-supine non-REM, while pressures of 12 cmH2O (with F&P filter) appeared effective in non-supine REM.    Oxygen saturations are considerably improved on CPAP. The oxygen saturation nadir at 8 cmH2O was 87% associated with an obstructive hypopnea in REM. Oxygen saturations improved with higher pressures and there was no significant time with oxygen saturations <=88% at pressures of 10 cmH2O and higher (all with the F&P filter).    Single-channel ECG predominately showed a sinus rhythm with no significant ectopy. Overall, the mean heart rate was 67 bpm.    There were moderately frequent periodic limb movements, with 34.6/hr (2.5/hr with arousals). Rare elevations of muscle tone in REM was observed. No apparent dream-enactment behaviors were observed.        -- IMPRESSION/COMMENT:  1.) G47.33 - Obstructive Sleep Apnea - This test demonstrates an effective PAP setting of 12 cmH2O being identified in non-supine sleep (pressure adjusted for home use without the F&P filter).     . Use of CPAP at 12 cmH2O should adequately control the patient's sleep apnea when non-supine sleep is maintained. Higher pressures may be needed when supine.   . If pressure intolerance is reported, or if clinically preferred, AutoCPAP with a range of 8-12cmH2O  could also be considered.  . The patient tolerated CPAP best when the pressure relief feature was set to 3 (C-Flex or EPR setting).  . Combined with CPAP, the patient should also be advised to sleep only non-supine. A higher maximum pressure could be considered if the patient is sleeping supine at home.  . General recommendations for patients with obstructive sleep apnea:  o Reduce or avoid the use of medications with  sedating or muscle-relaxant properties, wherever possible.  o Severity can also be worsened by alcohol use too close to bedtime.  o Weight loss can help reduce severity of sleep apnea (goal BMI<25).  o Recommendations against driving while sleepy should be given to the patient.  . Referral to sleep medicine for on-going management of the sleep apnea is recommended.    2.) No significant hypoxemia once sleep apnea is controlled.        3.) Oral leak was observed with a nasal interface so she was changed to the DreamWear full face mask (small cushion/small frame). Leak was marginally controlled with that mask. Close attention to mask fit is recommended. She may do better with refitting to a different model of mask or trying a chin strap with a nasal interface.    4.) Rare epochs of REM sleep without atonia are of unclear significance and clinical correlation is advised. This may be related to the residual sleep apnea that was persisting during REM and may improve with sustained PAP therapy. Motorically stimulating medications may contribute to this, if started recently, but her medication list from 12/26/19 does not suggest this would be a factor. Evaluation for early signs of an alpha-synucleinopathy may be indicated. The patient should be advised to maintain a safe sleep environment and general recommendations routinely include:  ? The bedroom should be on the lowest floor in the house.  ? Mattress should be as low to the floor as possible.  ? Furniture should be moved away from the bed (out of reach) or padded.  ? If the bed is against a wall, that should be padded.  ? Bedroom windows should be covered by heavy curtains and pinned closed while sleeping.  ? All sharp or hazardous things (like knives, guns, pills, etc.) should kept out of the bedroom and stored securely.  ? Doors should be closed when sleeping. Locks and/or door alarms may also be helpful.  ? The patient should sleep in their own bed. Otherwise, a  protective barrier should be used between the patient and any bed partner.  ? Medications that can increase muscle tone at night should be avoided where possible.  ? Alcohol and caffeine should be avoided.  ? Keeping a consistent sleep schedule and sleep location is advisable.  ? Sleep deprivation should be avoided.  ? Sleep disrupting disorders (such as sleep apnea) should be treated.  ? The patient should try to reduce life stressors where possible.  ? Any other disruptions to sleep (such as noise or pets) should be addressed.    5.) Cardiac monitoring revealed no significant abnormalities.            Electronically signed by:         Magdalene Molly, MD 04/28/2020        Board Certified, Sleep Medicine    CC: Edgar Frisk, MD

## 2020-04-19 NOTE — Progress Notes (Signed)
PRE-POST QUESTIONNAIRE    EVENING QUESTIONS   EVENING PATIENT QUESTIONNAIRE 04/19/2020   What is your height (feet/inches) and weight (pounds)? 5'7'' and 268 lbs   How much sleep did you get last night? 8-9 hours   Is this normal for you? No   What position do you normally sleep in at home? Sides; Back   Did you have any naps today No   Did you have any of the following today? Tea   If yes, how much? herbal tea-2 cups   If yes, when? 1700   Did you drink any alcoholic beverages today? No   At what time was your last meal? 1700   Do you have any new illnesses since your last visit with your provider? No   Do you have any physical complaints right now? yes-headache from being tired   Check the statement(s) that best describe how you feel right now: Foggy   How did you first learn about sleep apnea? Doctor   How did you learn about our program at the sleep center? Primary Care Provider       MORNING QUESTIONS  MORNING PATIENT QUESTIONNAIRE 04/19/2020   How long did it feel like it took you to fall asleep last night? 1-1.5 hours   How long do you feel you slept last night? about 4-5 hours   Is this normal for you? (No Data)   How many times do you remember waking up last night? 3   Do you have any physical complaints right now? No   Did you take any medications to help you sleep last night? No   Was your sleep last night Deep; Restful   Do you remember any dreams from last night? Yes   The quality of my sleep last night was: Better   Check the statement(s) that best describe how you feel right now Relaxed   Did you wear a CPAP last night? Yes   Did you find it tolerable? Yes   Do you feel more rested this morning? Yes   Would you consider using it at home? Yes   STAFF : Please summarize your overall experience in the Sleep Center (0 Unacceptable to 10 Outstanding) 10

## 2020-04-19 NOTE — Progress Notes (Signed)
The patient is currently at the sleep center for a CPAP titration. The patient arrived appearing nervous but alert. Protocol was explained upon arrival.     Prior to lights out, the patient was fitted with a DreamWear NM small/small. A trial of CPAP at 5CM H2O was given to the patient. No tolerance or mask issues were observed. Due to a significant leak caused by the patient opening her mouth during the study, the patient's mask was switched to a DreamWear FFM small/small. The patient struggled with this mask at first, but once the patient was able to relax, the mask appeared to become tolerable.     Lights off: 2050  Lights on: 0528

## 2020-05-08 IMAGING — CR DG KNEE COMPLETE 4+V*R*
4 series · 4 of 4 positions shown · non-contrast
Comparison: None.

CLINICAL DATA: Chronic bilateral knee pain without known injury.

EXAM:
RIGHT KNEE - COMPLETE 4+ VIEW

[t knee ap right]
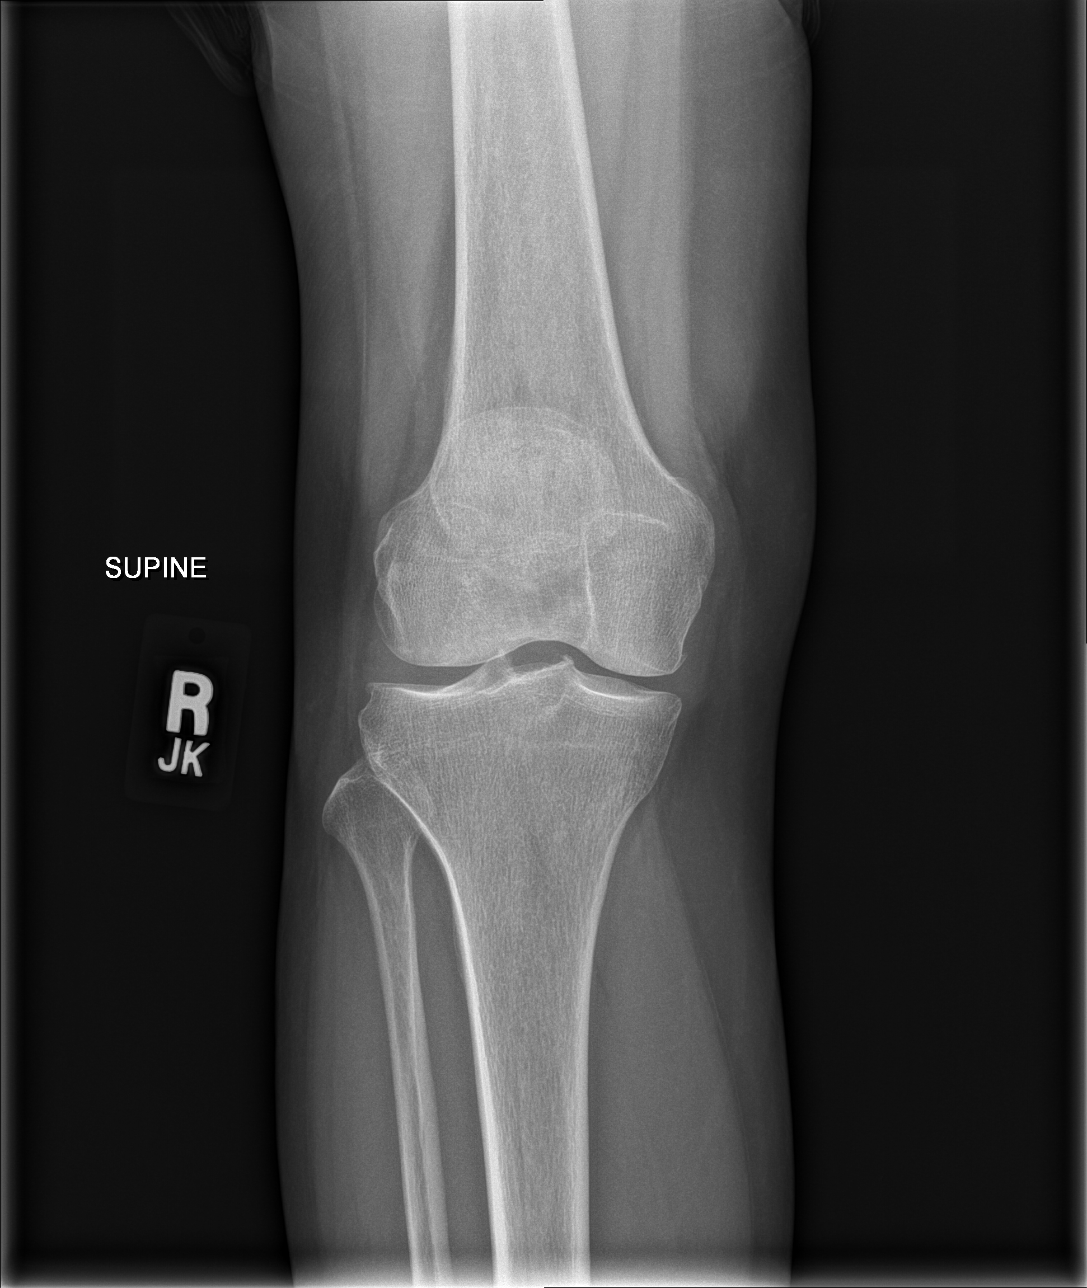

[t knee obl right (1 of 2)]
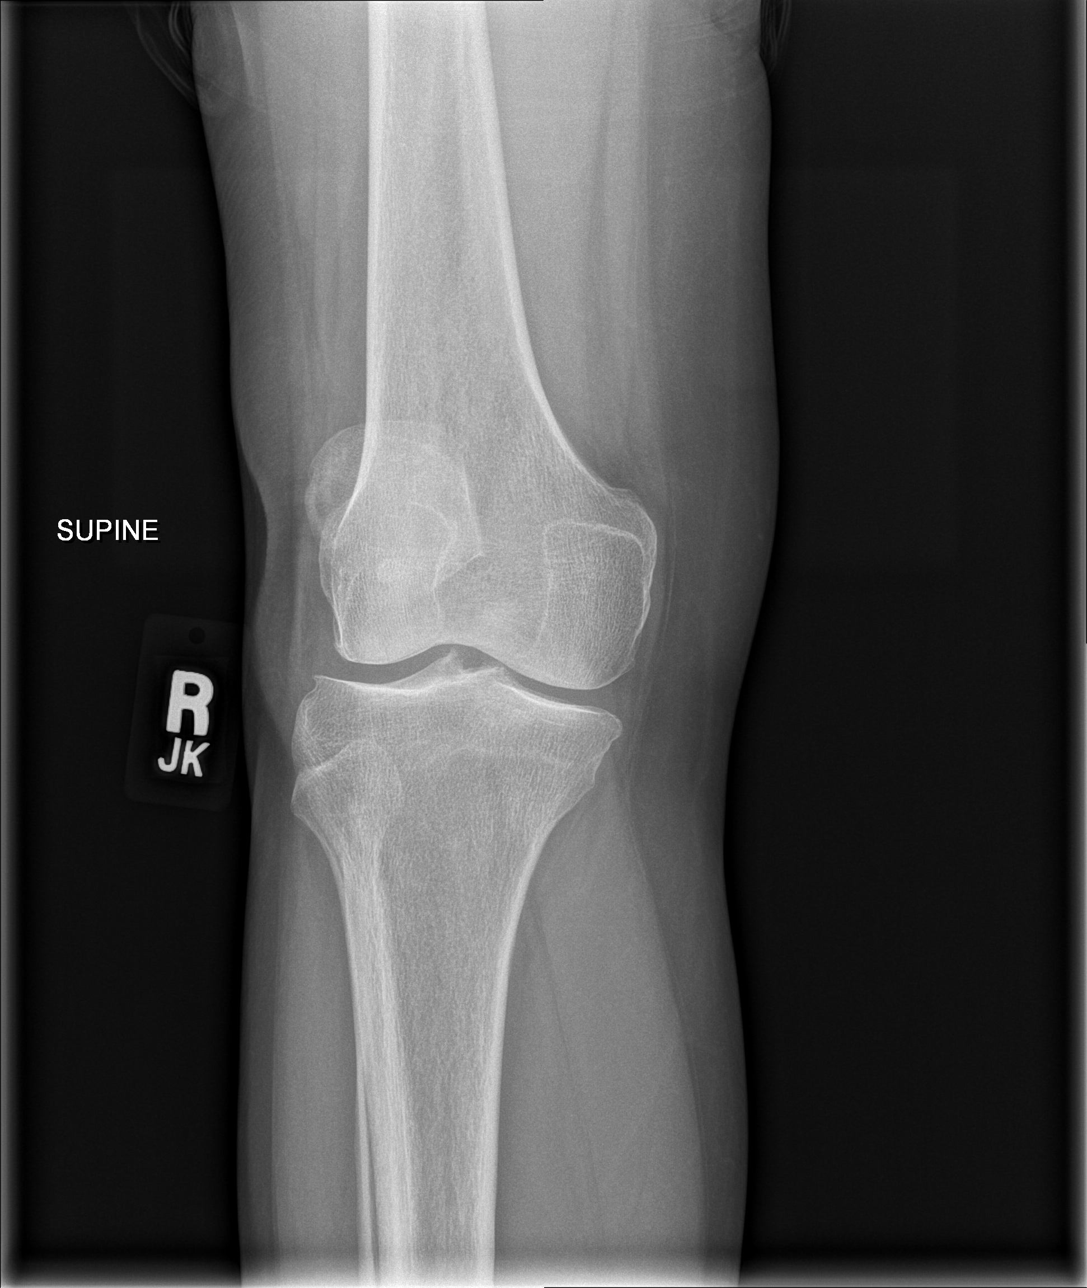

[t knee obl right (2 of 2)]
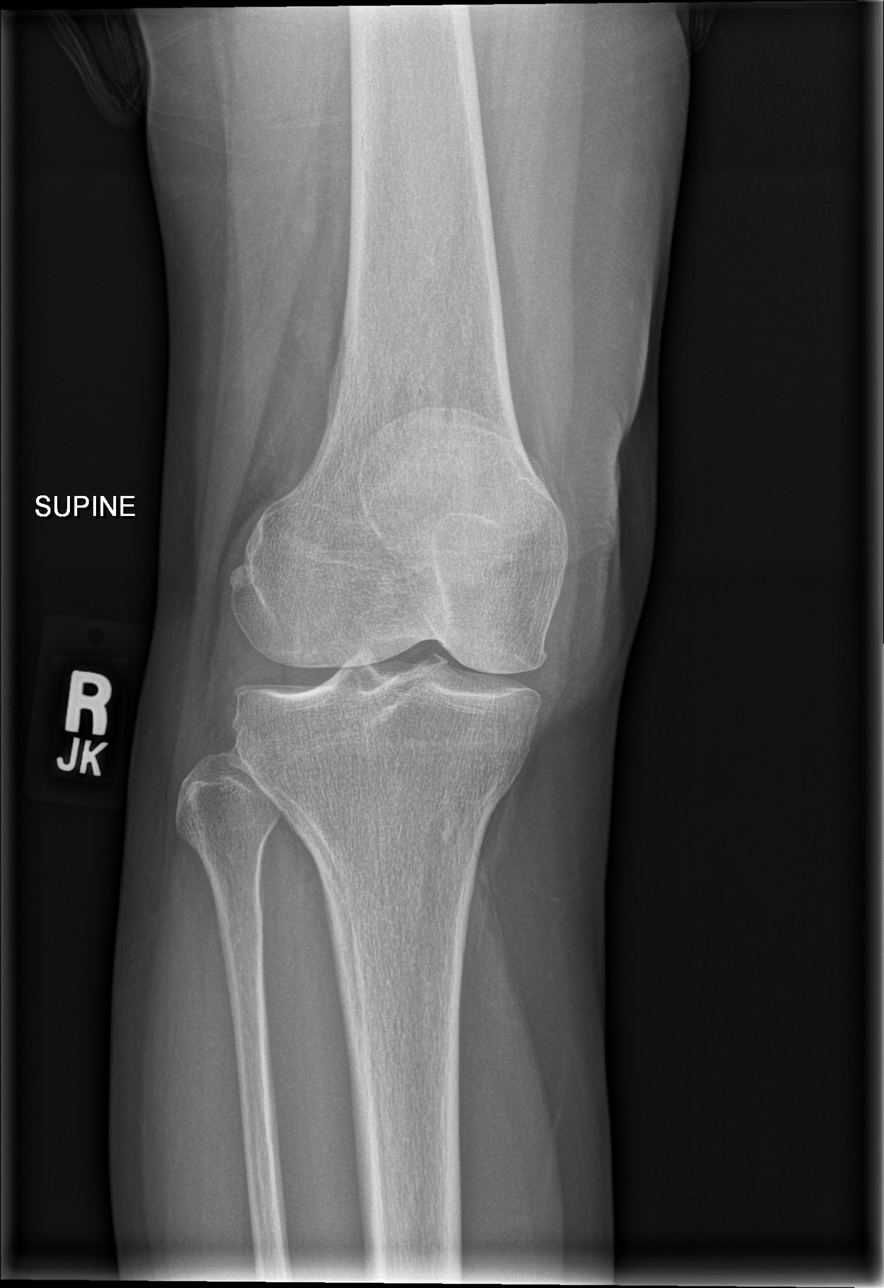

[t knee lat right]
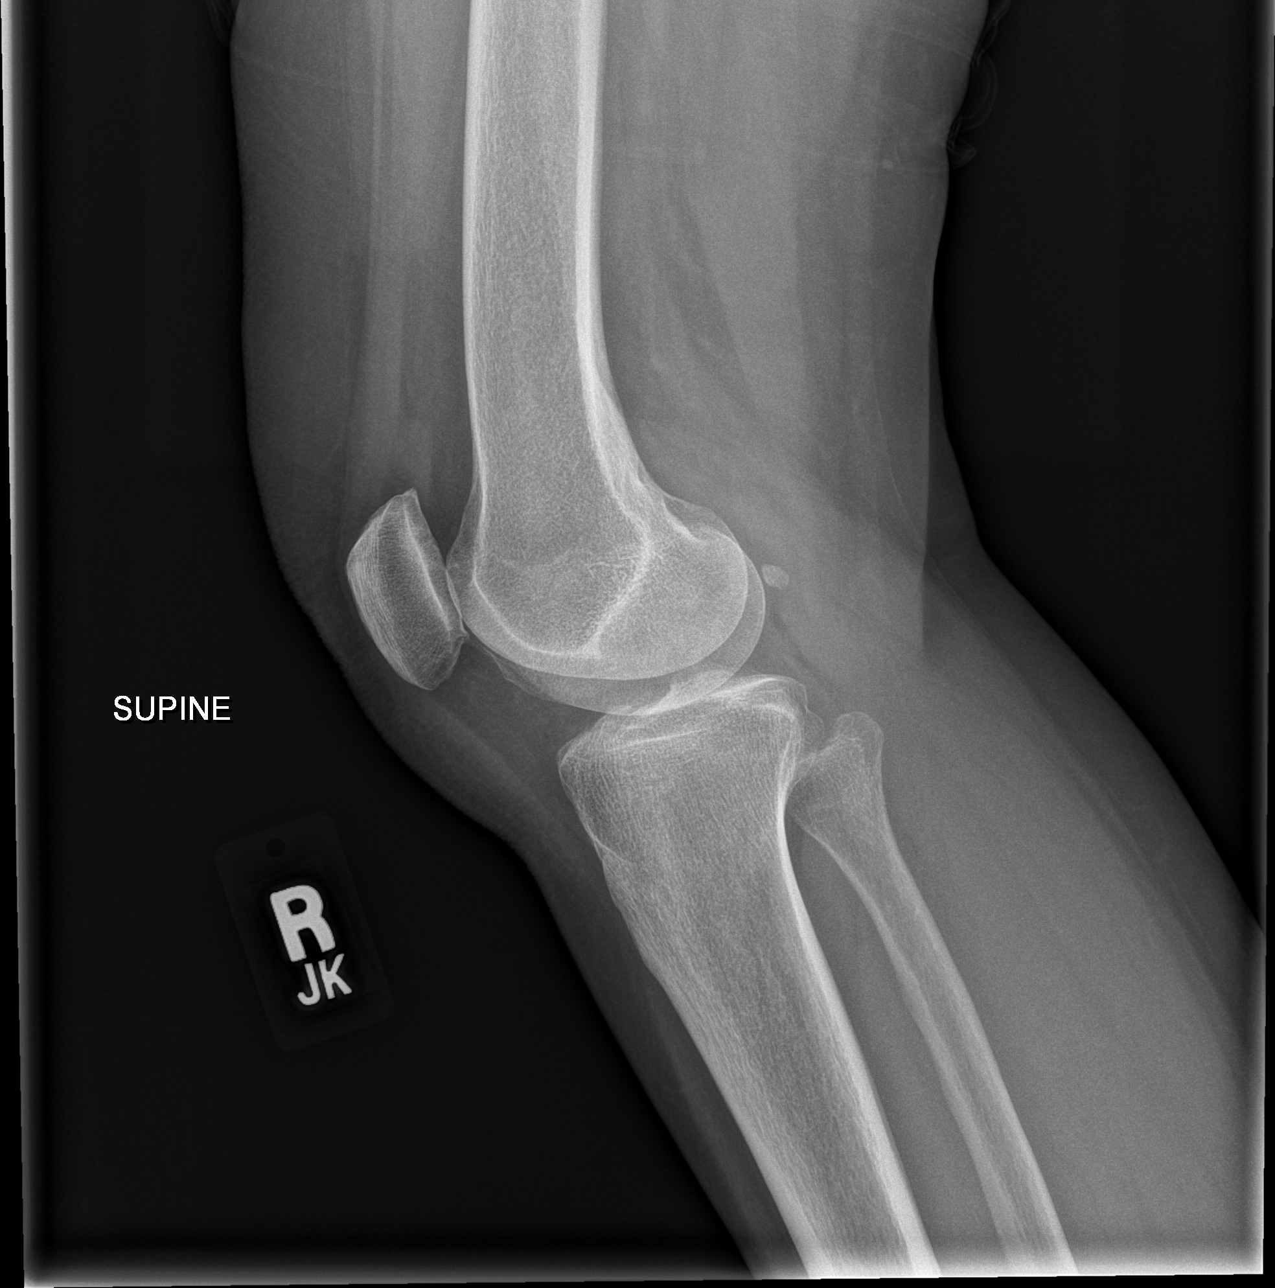

[4 of 4 positions shown; findings below may reference images not displayed]

FINDINGS: No evidence of fracture, dislocation, or joint effusion. No evidence
of arthropathy or other focal bone abnormality. Soft tissues are
unremarkable.
IMPRESSION: Negative.

## 2020-05-21 DIAGNOSIS — J069 Acute upper respiratory infection, unspecified: Secondary | ICD-10-CM | POA: Diagnosis not present

## 2020-05-21 DIAGNOSIS — Z20828 Contact with and (suspected) exposure to other viral communicable diseases: Secondary | ICD-10-CM | POA: Diagnosis not present

## 2020-07-17 ENCOUNTER — Other Ambulatory Visit: Payer: Self-pay | Admitting: Nurse Practitioner

## 2020-07-17 DIAGNOSIS — Z1231 Encounter for screening mammogram for malignant neoplasm of breast: Secondary | ICD-10-CM

## 2020-10-01 ENCOUNTER — Ambulatory Visit
Admission: RE | Admit: 2020-10-01 | Discharge: 2020-10-01 | Disposition: A | Discharge status: Home / Self Care | Payer: Managed Care, Other (non HMO) | Source: Ambulatory Visit | Attending: Nurse Practitioner | Admitting: Nurse Practitioner

## 2020-10-01 ENCOUNTER — Other Ambulatory Visit: Payer: Self-pay

## 2020-10-01 DIAGNOSIS — Z1231 Encounter for screening mammogram for malignant neoplasm of breast: Secondary | ICD-10-CM

## 2021-02-18 ENCOUNTER — Encounter: Payer: Self-pay | Admitting: Gastroenterology

## 2021-09-03 ENCOUNTER — Other Ambulatory Visit: Payer: Self-pay | Admitting: Nurse Practitioner

## 2021-09-03 DIAGNOSIS — Z1231 Encounter for screening mammogram for malignant neoplasm of breast: Secondary | ICD-10-CM

## 2021-09-06 ENCOUNTER — Other Ambulatory Visit: Payer: Self-pay | Admitting: Family Medicine

## 2021-09-06 DIAGNOSIS — E2839 Other primary ovarian failure: Secondary | ICD-10-CM

## 2021-09-06 DIAGNOSIS — N644 Mastodynia: Secondary | ICD-10-CM

## 2021-10-01 ENCOUNTER — Ambulatory Visit
Admission: RE | Admit: 2021-10-01 | Discharge: 2021-10-01 | Disposition: A | Discharge status: Home / Self Care | Payer: Managed Care, Other (non HMO) | Source: Ambulatory Visit | Attending: Family Medicine | Admitting: Family Medicine

## 2021-10-01 ENCOUNTER — Ambulatory Visit: Payer: Managed Care, Other (non HMO)

## 2021-10-01 DIAGNOSIS — N644 Mastodynia: Secondary | ICD-10-CM

## 2021-12-16 ENCOUNTER — Encounter: Payer: Self-pay | Admitting: Gastroenterology

## 2022-01-22 ENCOUNTER — Ambulatory Visit (INDEPENDENT_AMBULATORY_CARE_PROVIDER_SITE_OTHER): Payer: Managed Care, Other (non HMO) | Admitting: Gastroenterology

## 2022-01-22 ENCOUNTER — Encounter: Payer: Self-pay | Admitting: Gastroenterology

## 2022-01-22 VITALS — BP 126/84 | HR 67 | Ht 65.0 in | Wt 149.4 lb

## 2022-01-22 DIAGNOSIS — Z1212 Encounter for screening for malignant neoplasm of rectum: Secondary | ICD-10-CM

## 2022-01-22 DIAGNOSIS — Z1211 Encounter for screening for malignant neoplasm of colon: Secondary | ICD-10-CM | POA: Diagnosis not present

## 2022-01-22 MED ORDER — CLENPIQ 10-3.5-12 MG-GM -GM/175ML PO SOLN: 1.0000 | ORAL | 0 refills | Status: DC

## 2022-01-22 MED ORDER — DIAZEPAM 10 MG PO TABS: 10.0000 mg | ORAL_TABLET | Freq: Four times a day (QID) | ORAL | 0 refills | Status: AC | PRN

## 2022-01-22 NOTE — Progress Notes (Signed)
Chief Complaint: for colon  Referring Provider:  Cyndi Bender, PA-C      ASSESSMENT AND PLAN;   #1. CRC screening  Plan: -Colon -Valium '10mg'$  6 hrs before and one prior prn #2 tabs     Discussed risks & benefits of colonoscopy. Risks including rare perforation req laparotomy, bleeding after bx/polypectomy req blood transfusion, rarely missing neoplasms, risks of anesthesia/sedation, rare risk of damage to internal organs. Benefits outweigh the risks. Patient agrees to proceed. All the questions were answered. Pt consents to proceed.  HPI:    Destiny Turner is a 61 y.o. female  With anxiety (specially with IV):, H/O IBS-D  For colorectal cancer screening.  No nausea, vomiting, heartburn, regurgitation, odynophagia or dysphagia.  No sig diarrhea or constipation.  No melena or hematochezia. No unintentional weight loss. No abdominal pain.  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  GM - colon ca at age 27  Previous GI work-up: Colonoscopy 11/18/2010 [facility].  Small internal hemorrhoids.  Otherwise normal colonoscopy to TI.  Random colon biopsies negative for microscopic colitis.  SH-son and daughter (adopted).  4 grandkids Past Medical History:  Diagnosis Date   Depression    Early menopause    Endometriosis    Hypertension 07/09/2012   Episodic; no medications prescribed.   Infertility, female     Past Surgical History:  Procedure Laterality Date   BREAST CYST ASPIRATION     COLONOSCOPY  11/18/2010   Small internal hemorrhoids. Otherwise normal colonoscopy to terminal ileum, A few areas of patchy erythema (likely due to preparation-biopsied)   LAPAROSCOPY     3x for endometriosis two in 1980s and one in early 1990s   TONSILLECTOMY      Family History  Problem Relation Age of Onset   Stroke Mother    Pulmonary Hypertension Mother    Arthritis Mother    Other Mother        back  problems   Ulcerative colitis Mother 53   Heart attack Father    Other Father        rheumatoid lung, back problems   Pneumonia Father        died of this    Stroke Brother    Breast cancer Maternal Aunt    Colon cancer Maternal Grandmother    Migraines Neg Hx    Headache Neg Hx     Social History   Tobacco Use   Smoking status: Never   Smokeless tobacco: Never  Vaping Use   Vaping Use: Never used  Substance Use Topics   Alcohol use: No   Drug use: No    Current Outpatient Medications  Medication Sig Dispense Refill   sertraline (ZOLOFT) 50 MG tablet Take 50 mg by mouth daily.     tiZANidine (ZANAFLEX) 4 MG tablet Take 2-4 mg by mouth 2 (two) times daily as needed for muscle spasms.     zolpidem (AMBIEN) 5 MG tablet Take 5 mg by mouth at bedtime.                       No current facility-administered medications for this visit.    Allergies  Allergen Reactions   Sulfa Antibiotics     Review of Systems:  Constitutional: Denies fever, chills, diaphoresis, appetite change and fatigue.  HEENT: Denies photophobia, eye pain, redness, hearing loss, ear pain, congestion, sore throat, rhinorrhea, sneezing, mouth sores,  neck pain, neck stiffness and tinnitus.   Respiratory: Denies SOB, DOE, cough, chest tightness,  and wheezing.   Cardiovascular: Denies chest pain, palpitations and leg swelling.  Genitourinary: Denies dysuria, urgency, frequency, hematuria, flank pain and difficulty urinating.  Musculoskeletal: Denies myalgias, back pain, joint swelling, arthralgias and gait problem.  Skin: No rash.  Neurological: Denies dizziness, seizures, syncope, weakness, light-headedness, numbness and headaches.  Hematological: Denies adenopathy. Easy bruising, personal or family bleeding history  Psychiatric/Behavioral: has anxiety or depression     Physical Exam:    BP 126/84   Pulse 67   Ht '5\' 5"'$  (1.651 m)   Wt 149 lb 6.4 oz (67.8 kg)   SpO2 98%   BMI 24.86 kg/m  Wt  Readings from Last 3 Encounters:  01/22/22 149 lb 6.4 oz (67.8 kg)  09/30/18 142 lb 3.2 oz (64.5 kg)  09/28/18 146 lb (66.2 kg)   Constitutional:  Well-developed, in no acute distress. Psychiatric: Normal mood and affect. Behavior is normal. HEENT: Pupils normal.  Conjunctivae are normal. No scleral icterus. Cardiovascular: Normal rate, regular rhythm. No edema Pulmonary/chest: Effort normal and breath sounds normal. No wheezing, rales or rhonchi. Abdominal: Soft, nondistended. Nontender. Bowel sounds active throughout. There are no masses palpable. No hepatomegaly. Rectal: Deferred Neurological: Alert and oriented to person place and time. Skin: Skin is warm and dry. No rashes noted.      Carmell Austria, MD 01/22/2022, 9:05 AM  Cc: Cyndi Bender, PA-C

## 2022-01-22 NOTE — Patient Instructions (Addendum)
_______________________________________________________  If you are age 61 or older, your body mass index should be between 23-30. Your Body mass index is 24.86 kg/m. If this is out of the aforementioned range listed, please consider follow up with your Primary Care Provider.  If you are age 17 or younger, your body mass index should be between 19-25. Your Body mass index is 24.86 kg/m. If this is out of the aformentioned range listed, please consider follow up with your Primary Care Provider.   ________________________________________________________  The [facility] providers would like to encourage you to use [facility] to communicate with providers for non-urgent requests or questions.  Due to long hold times on the telephone, sending your provider a message by [facility] may be a faster and more efficient way to get a response.  Please allow 48 business hours for a response.  Please remember that this is for non-urgent requests.  _______________________________________________________  Destiny Turner have been scheduled for a colonoscopy. Please follow written instructions given to you at your visit today.  Please pick up your prep supplies at the pharmacy within the next 1-3 days. If you use inhalers (even only as needed), please bring them with you on the day of your procedure.  We have sent the following medications to your pharmacy for you to pick up at your convenience: Clenpiq  Thank you,  Dr. Jackquline Denmark

## 2022-03-03 ENCOUNTER — Ambulatory Visit
Admission: RE | Admit: 2022-03-03 | Discharge: 2022-03-03 | Disposition: A | Discharge status: Home / Self Care | Payer: Managed Care, Other (non HMO) | Source: Ambulatory Visit | Attending: Family Medicine | Admitting: Family Medicine

## 2022-03-03 DIAGNOSIS — E2839 Other primary ovarian failure: Secondary | ICD-10-CM

## 2022-03-06 ENCOUNTER — Encounter: Payer: Self-pay | Admitting: Gastroenterology

## 2022-03-13 ENCOUNTER — Ambulatory Visit: Payer: Managed Care, Other (non HMO) | Admitting: Gastroenterology

## 2022-03-13 ENCOUNTER — Encounter: Payer: Self-pay | Admitting: Gastroenterology

## 2022-03-13 VITALS — BP 136/68 | HR 76 | Temp 98.7°F | Resp 13 | Ht 65.0 in | Wt 149.0 lb

## 2022-03-13 DIAGNOSIS — Z1212 Encounter for screening for malignant neoplasm of rectum: Secondary | ICD-10-CM | POA: Diagnosis not present

## 2022-03-13 DIAGNOSIS — Z1211 Encounter for screening for malignant neoplasm of colon: Secondary | ICD-10-CM

## 2022-03-13 DIAGNOSIS — D128 Benign neoplasm of rectum: Secondary | ICD-10-CM

## 2022-03-13 MED ORDER — SODIUM CHLORIDE 0.9 % IV SOLN: 500.0000 mL | Freq: Once | INTRAVENOUS | Status: DC

## 2022-03-13 NOTE — Patient Instructions (Signed)
Please read handouts provided. Continue present medications. Await pathology results.   YOU HAD AN ENDOSCOPIC PROCEDURE TODAY AT THE  ENDOSCOPY CENTER:   Refer to the procedure report that was given to you for any specific questions about what was found during the examination.  If the procedure report does not answer your questions, please call your gastroenterologist to clarify.  If you requested that your care partner not be given the details of your procedure findings, then the procedure report has been included in a sealed envelope for you to review at your convenience later.  YOU SHOULD EXPECT: Some feelings of bloating in the abdomen. Passage of more gas than usual.  Walking can help get rid of the air that was put into your GI tract during the procedure and reduce the bloating. If you had a lower endoscopy (such as a colonoscopy or flexible sigmoidoscopy) you may notice spotting of blood in your stool or on the toilet paper. If you underwent a bowel prep for your procedure, you may not have a normal bowel movement for a few days.  Please Note:  You might notice some irritation and congestion in your nose or some drainage.  This is from the oxygen used during your procedure.  There is no need for concern and it should clear up in a day or so.  SYMPTOMS TO REPORT IMMEDIATELY:  Following lower endoscopy (colonoscopy or flexible sigmoidoscopy):  Excessive amounts of blood in the stool  Significant tenderness or worsening of abdominal pains  Swelling of the abdomen that is new, acute  Fever of 100F or higher  For urgent or emergent issues, a gastroenterologist can be reached at any hour by calling [phone]. Do not use MyChart messaging for urgent concerns.    DIET:  We do recommend a small meal at first, but then you may proceed to your regular diet.  Drink plenty of fluids but you should avoid alcoholic beverages for 24 hours.  ACTIVITY:  You should plan to take it easy for  the rest of today and you should NOT DRIVE or use heavy machinery until tomorrow (because of the sedation medicines used during the test).    FOLLOW UP: Our staff will call the number listed on your records the next business day following your procedure.  We will call around 7:15- 8:00 am to check on you and address any questions or concerns that you may have regarding the information given to you following your procedure. If we do not reach you, we will leave a message.     If any biopsies were taken you will be contacted by phone or by letter within the next 1-3 weeks.  Please call us at [phone] if you have not heard about the biopsies in 3 weeks.    SIGNATURES/CONFIDENTIALITY: You and/or your care partner have signed paperwork which will be entered into your electronic medical record.  These signatures attest to the fact that that the information above on your After Visit Summary has been reviewed and is understood.  Full responsibility of the confidentiality of this discharge information lies with you and/or your care-partner. 

## 2022-03-13 NOTE — Progress Notes (Signed)
Chief Complaint: for colon  Referring Provider:  Cyndi Bender, PA-C      ASSESSMENT AND PLAN;   #1. CRC screening  Plan: -Colon -Valium '10mg'$  6 hrs before and one prior prn #2 tabs     Discussed risks & benefits of colonoscopy. Risks including rare perforation req laparotomy, bleeding after bx/polypectomy req blood transfusion, rarely missing neoplasms, risks of anesthesia/sedation, rare risk of damage to internal organs. Benefits outweigh the risks. Patient agrees to proceed. All the questions were answered. Pt consents to proceed.  HPI:    Destiny Turner is a 61 y.o. female  With anxiety (specially with IV):, H/O IBS-D  For colorectal cancer screening.  No nausea, vomiting, heartburn, regurgitation, odynophagia or dysphagia.  No sig diarrhea or constipation.  No melena or hematochezia. No unintentional weight loss. No abdominal pain.  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  GM - colon ca at age 31  Previous GI work-up: Colonoscopy 11/18/2010 [facility].  Small internal hemorrhoids.  Otherwise normal colonoscopy to TI.  Random colon biopsies negative for microscopic colitis.  SH-son and daughter (adopted).  4 grandkids Past Medical History:  Diagnosis Date   Depression    Early menopause    Endometriosis    Hypertension 07/09/2012   Episodic; no medications prescribed.   Infertility, female     Past Surgical History:  Procedure Laterality Date   BREAST CYST ASPIRATION     COLONOSCOPY  11/18/2010   Small internal hemorrhoids. Otherwise normal colonoscopy to terminal ileum, A few areas of patchy erythema (likely due to preparation-biopsied)   LAPAROSCOPY     3x for endometriosis two in 1980s and one in early 1990s   TONSILLECTOMY      Family History  Problem Relation Age of Onset   Stroke Mother    Pulmonary Hypertension Mother    Arthritis Mother    Other Mother        back  problems   Ulcerative colitis Mother 18   Heart attack Father    Other Father        rheumatoid lung, back problems   Pneumonia Father        died of this    Stroke Brother    Breast cancer Maternal Aunt    Colon cancer Maternal Grandmother    Migraines Neg Hx    Headache Neg Hx     Social History   Tobacco Use   Smoking status: Never   Smokeless tobacco: Never  Vaping Use   Vaping Use: Never used  Substance Use Topics   Alcohol use: No   Drug use: No    Current Outpatient Medications  Medication Sig Dispense Refill   sertraline (ZOLOFT) 50 MG tablet Take 50 mg by mouth daily.     tiZANidine (ZANAFLEX) 4 MG tablet Take 2-4 mg by mouth 2 (two) times daily as needed for muscle spasms.     zolpidem (AMBIEN) 5 MG tablet Take 5 mg by mouth at bedtime.                       No current facility-administered medications for this visit.    Allergies  Allergen Reactions   Sulfa Antibiotics     Review of Systems:  Constitutional: Denies fever, chills, diaphoresis, appetite change and fatigue.  HEENT: Denies photophobia, eye pain, redness, hearing loss, ear pain, congestion, sore throat, rhinorrhea, sneezing, mouth sores,  neck pain, neck stiffness and tinnitus.   Respiratory: Denies SOB, DOE, cough, chest tightness,  and wheezing.   Cardiovascular: Denies chest pain, palpitations and leg swelling.  Genitourinary: Denies dysuria, urgency, frequency, hematuria, flank pain and difficulty urinating.  Musculoskeletal: Denies myalgias, back pain, joint swelling, arthralgias and gait problem.  Skin: No rash.  Neurological: Denies dizziness, seizures, syncope, weakness, light-headedness, numbness and headaches.  Hematological: Denies adenopathy. Easy bruising, personal or family bleeding history  Psychiatric/Behavioral: has anxiety or depression     Physical Exam:    BP (!) 159/99   Pulse 97   Temp 98.7 F (37.1 C)   Ht '5\' 5"'$  (1.651 m)   Wt 149 lb (67.6 kg)   SpO2 99%    BMI 24.79 kg/m  Wt Readings from Last 3 Encounters:  03/13/22 149 lb (67.6 kg)  01/22/22 149 lb 6.4 oz (67.8 kg)  09/30/18 142 lb 3.2 oz (64.5 kg)   Constitutional:  Well-developed, in no acute distress. Psychiatric: Normal mood and affect. Behavior is normal. HEENT: Pupils normal.  Conjunctivae are normal. No scleral icterus. Cardiovascular: Normal rate, regular rhythm. No edema Pulmonary/chest: Effort normal and breath sounds normal. No wheezing, rales or rhonchi. Abdominal: Soft, nondistended. Nontender. Bowel sounds active throughout. There are no masses palpable. No hepatomegaly. Rectal: Deferred Neurological: Alert and oriented to person place and time. Skin: Skin is warm and dry. No rashes noted.      Carmell Austria, MD 03/13/2022, 10:49 AM  Cc: Cyndi Bender, PA-C

## 2022-03-13 NOTE — Op Note (Signed)
[facility] Patient Name: Destiny Turner Procedure Date: 03/13/2022 10:56 AM MRN: [id number] Endoscopist: Jackquline Denmark , MD Age: 61 Referring MD:  Date of Birth: 10-07-1960 Gender: Female Account #: [id number] Procedure:                Colonoscopy Indications:              Screening for colorectal malignant neoplasm Medicines:                Monitored Anesthesia Care Procedure:                Pre-Anesthesia Assessment:                           - Prior to the procedure, a History and Physical                            was performed, and patient medications and                            allergies were reviewed. The patient's tolerance of                            previous anesthesia was also reviewed. The risks                            and benefits of the procedure and the sedation                            options and risks were discussed with the patient.                            All questions were answered, and informed consent                            was obtained. Prior Anticoagulants: The patient has                            taken no previous anticoagulant or antiplatelet                            agents. ASA Grade Assessment: II - A patient with                            mild systemic disease. After reviewing the risks                            and benefits, the patient was deemed in                            satisfactory condition to undergo the procedure.                           After obtaining informed consent, the colonoscope  was passed under direct vision. Throughout the                            procedure, the patient's blood pressure, pulse, and                            oxygen saturations were monitored continuously. The                            PCF-HQ190L Colonoscope was introduced through the                            anus and advanced to the 2 cm into the ileum. The                            colonoscopy was  performed without difficulty. The                            patient tolerated the procedure well. The quality                            of the bowel preparation was good. The terminal                            ileum, ileocecal valve, appendiceal orifice, and                            rectum were photographed. Scope In: 10:59:51 AM Scope Out: 11:13:59 AM Scope Withdrawal Time: 0 hours 10 minutes 43 seconds  Total Procedure Duration: 0 hours 14 minutes 8 seconds  Findings:                 A 4 mm polyp was found in the mid rectum. The polyp                            was sessile. The polyp was removed with a cold                            snare. Resection and retrieval were complete.                           A few small-mouthed diverticula were found in the                            sigmoid colon.                           Non-bleeding internal hemorrhoids were found during                            retroflexion. The hemorrhoids were small and Grade                            I (internal hemorrhoids that do not prolapse).  The terminal ileum appeared normal.                           The exam was otherwise without abnormality on                            direct and retroflexion views. Complications:            No immediate complications. Estimated Blood Loss:     Estimated blood loss: none. Impression:               - One 4 mm polyp in the mid rectum, removed with a                            cold snare. Resected and retrieved.                           - Mild sigmoid diverticulosis.                           - Non-bleeding internal hemorrhoids.                           - The examined portion of the ileum was normal.                           - The examination was otherwise normal on direct                            and retroflexion views. Recommendation:           - Patient has a contact number available for                            emergencies. The  signs and symptoms of potential                            delayed complications were discussed with the                            patient. Return to normal activities tomorrow.                            Written discharge instructions were provided to the                            patient.                           - Resume previous diet.                           - Continue present medications.                           - Await pathology results.                           -  Repeat colonoscopy for surveillance based on                            pathology results.                           - The findings and recommendations were discussed                            with the patient's family. Jackquline Denmark, MD 03/13/2022 11:18:38 AM This report has been signed electronically.

## 2022-03-13 NOTE — Progress Notes (Signed)
PT taken to PACU. Monitors in place. VSS. Report given to RN. 

## 2022-03-14 ENCOUNTER — Telehealth: Payer: Self-pay | Admitting: *Deleted

## 2022-03-14 NOTE — Telephone Encounter (Signed)
  Follow up Call-     03/13/2022   10:14 AM  Call back number  Post procedure Call Back phone  # [phone]  Permission to leave phone message Yes    Follow up phone call. No answer at number given.  Left message on voicemail.

## 2022-03-22 ENCOUNTER — Encounter: Payer: Self-pay | Admitting: Gastroenterology

## 2022-08-07 NOTE — Progress Notes (Signed)
Name: Claudia Soto  DOB: 15-Jun-1960  Phone: [phone] (home)  Account number: [id number]  DOS: 08/07/2022    Primary Care Provider: Edgar Frisk, MD    Referring Provider: No ref. provider found    Claudia Soto is a 62 y.o. female who presents with No chief complaint on file.  Marland Kitchen      HISTORY OF PRESENT ILLNESS     HPI    New patient self-referral for right wrist pain.  Patient reports that she has had several instances of pain in the radial aspect of her right wrist.  Began a few years ago.  She has been seen by her primary care physician and another orthopedic surgeon in a different town for this.  She was initially treated with a splint, and subsequently underwent a couple rounds of corticosteroid injection.  She reports that after the second injection she had a reaction in the skin around the injection site, which was quite severe and lasted for quite some time.  She reports that she has had recurrence and persistence of the pain in her right wrist.  Denies antecedent trauma or injury.  No associated numbness or tingling.  She does report that the left hand has been painful, and she locates that pain to the dorsal aspect of the left index finger, centered primarily over the proximal phalanx and PIP joint.  No injuries that she can recall.  It began insidiously, progressed quickly to a lot of swelling and exquisite pain.  The swelling and exquisite pain has resolved.  She is left with some occasional soreness.  No locking or catching.  No problems updated.  No past medical history on file.  No family history on file.  No past surgical history on file.  Not on File  Social History     Socioeconomic History   . Marital status: Married       REVIEW OF SYSTEMS   Review of Systems    12 point review of systems negative except as noted above.    PHYSICAL EXAM   Vital signs: There were no vitals taken for this visit.  There is no height or weight on file to calculate BMI.      General: Well-appearing  otherwise healthy, in no acute distress, alert and oriented x3.    Head: Normocephalic atraumatic.    Neck: Supple no tenderness.    Chest: Normal respiratory excursions    Abdomen: Benign    Neurologic: Cranial nerves II through XII are grossly intact. He moves all extremities spontaneously.    Musculoskeletal: Gait and station appear normal. Bilateral lower extremity range of motion is full and painless. Bilateral upper extremity examination reveals full active and passive range of motion about the elbows, wrist, hands.  There is tenderness to palpation over the first dorsal compartment tendon sheath on the right.  There is some residual skin thinning and discoloration over the radial styloid.  Finkelstein's maneuver reproduces pain on the right.  Left hand examination reveals no specific tenderness to palpation.  Globally tender about the base of the index finger.  Range of motion is full and painless.  No erythema, edema, or ecchymosis.  Distally neurovascular intact.  MP and IP joint stability is full.      RADIOGRAPHS       XR Hand Right 3 + Vw    Result Date: 08/07/2022  Multiple views of the right hand were obtained. No fractures or dislocations are noted. Joint spaces are well-preserved.  Very subtle  lucency in the radial aspect of the head of the middle phalanx in the index finger DIP joint, potentially concerning for periarticular erosion, though may also be normal variation.  Alignment is normal.  No soft tissue mass or swelling.    XR Hand Left 3 + Vw    Result Date: 08/07/2022  Multiple views of the left hand were obtained. No fractures or dislocations are noted. Joint spaces are well-preserved. Alignment is normal.  No soft tissue mass or swelling.     ADDITIONAL IMAGING        ASSESSMENT     1. De Quervain's tenosynovitis, right    2. Bilateral hand pain    3. Pre-op testing    4. Fatigue, unspecified type        PLAN     Claudia Soto has fairly classic right wrist de Quervain's tenosynovitis.  We discussed  the problem and treatment options.  She has failed conservative management with bracing and multiple corticosteroid injections.  She got a significant skin reaction after her last injection.  We discussed other options, including surgical release.  We discussed the procedure and postop plan.  Risks, benefits, and alternatives were reviewed at length.  She demonstrates that their understanding.  She would like to go forward with that.  Authorization for right first dorsal compartment tendon sheath incision can be obtained, and surgery can be scheduled at her convenience.  She does admit to fairly significant sleep apnea, and appropriate preoperative testing will be obtained.  As far as her left index finger is concerned, I do not see any obvious cause for her pain and swelling.  Currently, it is not bothering her much.  I encouraged her to keep an eye on it, and treated with ice, elevation, rest, and NSAIDs as needed.            No follow-ups on file.      Electronically signed by:  Rhetta Mura MD    *This note was dictated using voice recognition software. If you have any questions concerning the content, please call our office.

## 2022-11-10 ENCOUNTER — Other Ambulatory Visit: Payer: Self-pay | Admitting: Family Medicine

## 2022-11-10 DIAGNOSIS — Z1231 Encounter for screening mammogram for malignant neoplasm of breast: Secondary | ICD-10-CM

## 2022-11-12 ENCOUNTER — Ambulatory Visit
Admission: RE | Admit: 2022-11-12 | Discharge: 2022-11-12 | Discharge status: Home / Self Care | Payer: Managed Care, Other (non HMO) | Source: Ambulatory Visit | Attending: Family Medicine | Admitting: Family Medicine

## 2022-11-12 DIAGNOSIS — Z1231 Encounter for screening mammogram for malignant neoplasm of breast: Secondary | ICD-10-CM

## 2023-03-13 NOTE — ED Provider Notes (Signed)
Chief Complaint   Patient presents with   . Heart Palpitations     Feeling flutters in heart at night when patient lays down to sleep x 3 weeks, pt states her apple watch alarmed her this morning that her heartrate was in the 140's, feel like I couldn't exhale my breath all the way hx of right bundle branch block.      HPI: This is a 62 y.o. female who presents with heart fluttering.    Patient states that she has been feeling fluttering in her heart every night for the last 3 nights.  This morning when she woke her Apple watch was alarming and her heart rate was in the 140s.  She felt fatigued and states she was breathing like she was running.  There is been no chest pain with it.  She denies any recent cough or cold, fevers or chills, nausea vomiting or diarrhea, or other recent illness.  She denies any previous similar history.    ROS:    HEENT: negative.    Cardiac: As above otherwise negative.    Respiratory: As above otherwise negative.    GI: negative.    GU: negative.    Musculoskeletal: negative.    Neuro: negative.    Integumentary: negative.    Psychiatric: negative.    All other systems negative.      PMH: No past medical history on file.     PSH: No past surgical history on file.    Social History:   Social History     Socioeconomic History   . Marital status: Married   Tobacco Use   . Smoking status: Never   Substance and Sexual Activity   . Alcohol use: Never   . Drug use: Never   .     Family History:   No family history on file..     Medications:   No current facility-administered medications on file prior to encounter.     Current Outpatient Medications on File Prior to Encounter   Medication Sig Dispense Refill   . fluticasone-salmeterol (ADVAIR DISKUS) 100-50 mcg/puff diskus inhaler INHALE 1 PUFF BY MOUTH TWICE A DAY 60 each 11      Allergies   Allergen Reactions   . Vicodin [Hydrocodone] Nausea And Vomiting       BP 143/86   Pulse 134   Temp 36.4 ?C (97.5 ?F) (Oral)   Resp 21   Ht  1.702 m (5' 7)   Wt 124.3 kg (274 lb)   SpO2 96%   BMI 42.91 kg/m?   General: Well nourished, well hydrated in no acute distress.   Neuro: Awake, alert, moves all four extremities well, balance normal, GCS 15.   HEENT: No scleral icterus, no conjunctival injection, PERRL, EOM's intact, mucous membranes moist.   Respiratory: Normal respiratory pattern, clear, equal breath sounds.    Cardiac: A-fib with RVR at 131  Gastrointestinal: Soft, high BMI, non-tender, no guarding, no rebound, no masses felt, liver and spleen not palpable.  Musculoskeletal: No painful or swollen joints.  Extremities move well.  Integumentary: Skin pink, warm, dry, no rashes or wounds.   Extremities:  No pitting dependent edema.  Psychiatric: Cooperative, calm, not responding to internal stimuli.     Medical Decision Making: The patient is in A-fib RVR.  It is a little unclear as to exactly when it started.  It sounds as though she is at least been having episodes of A-fib at night for the last 3 days  and it is possible she has been in atrial fibrillation the entire time.  The majority of her symptoms just began this morning however and her watch did not alarm until this morning.    New Prescriptions    No medications on file       Recent Results (from the past 24 hour(s))   ECG 12 lead   Result Value Ref Range    INTERPRETATION TEXT Not Confirmed    CBC with Differential   Result Value Ref Range    White Blood Cells 7.2 4.5 - 11 K/uL    Red Blood Cells 4.62 3.80 - 5.20 M/uL    Hemoglobin 13.5 11.9 - 15.7 g/dL    Hematocrit 16.1 09.6 - 46.9 %    MCV 85.7 80.0 - 98.0 fL    MCH 29.1 27.0 - 34.0 pg    MCHC 34.0 31.4 - 35.0 g/dL    RDW-CV 04.5 (H) 40.9 - 13.2 %    Platelet Count 251 142 - 424 K/uL    MPV 8.8 fL    % Neutrophils 64.0 40.0 - 70.0 %    % Lymphocytes 28.1 18.0 - 48.0 %    % Monocytes 4.9 2.0 - 10.0 %    % Eosinophils 2.1 0.0 - 5.0 %    % Basophils 0.9 0.0 - 2.0 %    Absolute Neutrophils 4.60 1.50 - 8.00 K/uL    Absolute Lymphocytes  2.00 1.00 - 3.50 K/uL    Absolute Monocytes 0.40 0.20 - 1.00 K/uL    Absolute Eosinophils 0.10 0.00 - 0.50 K/uL    Absolute Basophils 0.10 0.00 - 0.20 K/uL    % nRBC 0.0 per 100 WBCs    Absolute nRBC 0.00 K/uL   Comprehensive Metabolic Panel   Result Value Ref Range    Na 139 136 - 145 mmol/L    K 3.9 3.5 - 5.1 mmol/L    Cl 105 98 - 107 mmol/L    CO2 23 21 - 31 mmol/L    Anion Gap 11 mmol/L    Glucose 128 (H) 70 - 99 mg/dL    BUN 14 7 - 25 mg/dL    Creatinine 8.11 9.14 - 1.30 mg/dL    Calcium 9.9 8.2 - 78.2 mg/dL    Albumin 4.5 3.5 - 5.7 g/dL    Bilirubin Total 0.5 0.3 - 1.2 mg/dL    Total Protein 7.0 6.4 - 8.9 g/dL    AST 23 13 - 39 U/L    ALT 25 7 - 52 U/L    Alkaline Phosphatase 108 42 - 121 U/L    Globulin 2.5 g/dL    Albumin/Globulin Ratio 1.8     BUN/Creatinine Ratio 20.3     eGFR >60 >=60 mL/min/1.37m2   Troponin I   Result Value Ref Range    Troponin I <0.02 0.00 - 0.04 ng/mL      CBC shows no clinically significant abnormalities.    Chemistry panel showed a glucose 128 but is otherwise normal.    Troponin is normal at less than 0.02.    EKG: A-fib RVR with right bundle branch block and nonspecific ST-T wave changes.    XR Chest AP Portable   Final Result   IMPRESSION: No acute cardiopulmonary disease                I reviewed the patient's health history from her Apple Watch over the last several days.  She has been in a heart rate up  to 120 beginning yesterday, but not before, so it is possible she may have been in atrial fibrillation for 24 hours, but probably not longer than that.  I discussed with her the 2 options one being rate control and the other rhythm control and she chose rate control.  She wants to try going home with medications to see if she will spontaneously convert.  I discussed with her and the importance of follow-up on Monday to be sure she is back to normal sinus rhythm and she understands the importance of this and the risks of clots and strokes.    Diagnosis: A-fib RVR    Kristian Covey, MD, MD    This chart has been completed through a voice recognition program.  Frequent mistakes are often made in transcription.  The chart was proof-read but some errors may have escaped notice and may not reflect the actual statements of the examiner.     Dolores Hoose, MD  03/13/23 1302

## 2023-03-14 NOTE — ED Provider Notes (Signed)
[facility] EMERGENCY CENTER    eMERGENCY dEPARTMENT eNCOUnter    Name:  Claudia Soto  MRN:  [id number]  DOB:  06-02-1961    Date of Service:  03/14/2023        CHIEF COMPLAINT    Chief Complaint   Patient presents with   . Headache (Adult - New Onset Or New Symptoms)        INTERPRETER SERVICES:      HPI    Claudia Soto is a 61 y.o. female who presents sharp pain on left upper scalp and left eye, since resolved, started this AM, no other associated symptoms.  Was seen here yesterday for new onset A-fib, was given metoprolol to go home on, patient did not want to cardiovert, she says there is no chest pain or palpitations at the moment, shortness of breath, GI symptoms fever or chills.  Again headache is resolved no sinus pain or pressure symptoms ear pain sore throat, no other complaints     REVIEW OF SYSTEMS    See HPI for further details. Review of systems otherwise negative.       CURRENT MEDICATIONS    PTA Home Medications   Medication Sig   . fluticasone-salmeterol (ADVAIR DISKUS) 100-50 mcg/puff diskus inhaler INHALE 1 PUFF BY MOUTH TWICE A DAY        Past Medical, Surgical, Social, and Family History  No past medical history on file.  No past surgical history on file.  Allergies as of 03/14/2023 - Review Complete 03/14/2023   Allergen Reaction Noted   . Vicodin [hydrocodone] Nausea And Vomiting 03/13/2023     No family history on file.  Social History     Tobacco Use   . Smoking status: Never   . Smokeless tobacco: Never   Substance Use Topics   . Alcohol use: Never   . Drug use: Never        ALLERGIES    Allergies   Allergen Reactions   . Vicodin [Hydrocodone] Nausea And Vomiting        PHYSICAL EXAM    Vital Signs:  Temp: 36.8 ?C (98.2 ?F) Pulse: 104 Resp: 17 BP: 154/85 SpO2: 97 %.      Physical Exam  Constitutional:       Appearance: Normal appearance.   HENT:      Head: Normocephalic and atraumatic.      Right Ear: Tympanic membrane normal.      Left Ear: Tympanic membrane normal.      Nose:  Nose normal.      Comments: Sinuses are nontender to palpation     Mouth/Throat:      Mouth: Mucous membranes are moist.   Eyes:      Extraocular Movements: Extraocular movements intact.      Conjunctiva/sclera: Conjunctivae normal.      Pupils: Pupils are equal, round, and reactive to light.   Cardiovascular:      Rate and Rhythm: Normal rate. Rhythm irregular.   Pulmonary:      Effort: Pulmonary effort is normal.      Breath sounds: Normal breath sounds.   Musculoskeletal:      Cervical back: Normal range of motion and neck supple.   Skin:     General: Skin is warm and dry.   Neurological:      General: No focal deficit present.      Mental Status: She is alert and oriented to person, place, and time.      Cranial  Nerves: No cranial nerve deficit.      Sensory: No sensory deficit.      Motor: No weakness.      Coordination: Coordination normal.   Psychiatric:         Mood and Affect: Mood normal.         Behavior: Behavior normal.           EKG the EKG shows A-fib rate of 80, right bundle branch block noted to performed at 1119      RADIOLOGY  CT Head wo Contrast   Final Result   IMPRESSION:  No acute intracranial process identified.              XR Chest 2 Vw   Final Result   IMPRESSION:  Mild cardiac enlargement without overt edema or focal infiltrate                     LABS  Results for orders placed or performed during the hospital encounter of 03/14/23   CBC with Differential   Result Value Ref Range    White Blood Cells 8.4 4.5 - 11 K/uL    Red Blood Cells 4.50 3.80 - 5.20 M/uL    Hemoglobin 13.4 11.9 - 15.7 g/dL    Hematocrit 16.1 09.6 - 46.9 %    MCV 86.9 80.0 - 98.0 fL    MCH 29.7 27.0 - 34.0 pg    MCHC 34.2 31.4 - 35.0 g/dL    RDW-CV 04.5 (H) 40.9 - 13.2 %    Platelet Count 256 142 - 424 K/uL    MPV 8.7 fL    % Neutrophils 63.2 40.0 - 70.0 %    % Lymphocytes 27.9 18.0 - 48.0 %    % Monocytes 5.7 2.0 - 10.0 %    % Eosinophils 1.9 0.0 - 5.0 %    % Basophils 1.3 0.0 - 2.0 %    Absolute Neutrophils 5.30  1.50 - 8.00 K/uL    Absolute Lymphocytes 2.30 1.00 - 3.50 K/uL    Absolute Monocytes 0.50 0.20 - 1.00 K/uL    Absolute Eosinophils 0.20 0.00 - 0.50 K/uL    Absolute Basophils 0.10 0.00 - 0.20 K/uL    % nRBC 0.1 per 100 WBCs    Absolute nRBC 0.01 K/uL   Comprehensive Metabolic Panel   Result Value Ref Range    Na 137 136 - 145 mmol/L    K 4.2 3.5 - 5.1 mmol/L    Cl 106 98 - 107 mmol/L    CO2 22 21 - 31 mmol/L    Anion Gap 9 mmol/L    Glucose 99 70 - 99 mg/dL    BUN 18 7 - 25 mg/dL    Creatinine 8.11 9.14 - 1.30 mg/dL    Calcium 9.6 8.2 - 78.2 mg/dL    Albumin 4.2 3.5 - 5.7 g/dL    Bilirubin Total 0.4 0.3 - 1.2 mg/dL    Total Protein 6.9 6.4 - 8.9 g/dL    AST 20 13 - 39 U/L    ALT 23 7 - 52 U/L    Alkaline Phosphatase 96 42 - 121 U/L    Globulin 2.7 g/dL    Albumin/Globulin Ratio 1.6     BUN/Creatinine Ratio 26.1     eGFR >60 >=60 mL/min/1.21m2   Protime INR   Result Value Ref Range    Prothrombin Time 10.2 (L) 10.5 - 12.7 seconds    INR 1.0 0.9 - 1.2  ECG 12 lead   Result Value Ref Range    INTERPRETATION TEXT Not Confirmed        PROCEDURE  Procedures    Indication(s) for IV hydration in this patient is:         ED COURSE & MEDICAL DECISION MAKING    Patient arrived to the emergency department and was quickly triaged to the treatment area.  After nurses notes and vital signs were reviewed, the patient was examined and interviewed.  I was able to review the patient's external medical records via Epic and Care Everywhere.  These included recent admission notes and discharge summaries, recent primary care provider notes, and any specialty consultations that were available.  I was able to obtain collateral information from husband, who was at the bedside.  They were able to provide information regarding timing and onset of symptoms.  Pertinent labs and imaging studies were reviewed. (See chart for details). Comorbid conditions include new onset a fib, copd    Differential includes, but not limited to: headache,  migraines, TIA, CVA, A fib      In summary a 62 year old female comes in for left-sided scalp and left lateral sinus or face pain that has since resolved, new set on A-fib yesterday.  No chest pain palpitations at the moment, rhythm appears to still be A-fib based on monitor, will obtain EKG.  No neurodeficits noted at all in the room.  Will can do a CT of the head without contrast to start EKG CBC and CMP to start      Labs and CTs and x-rays reviewed, all unremarkable, mild cardiomegaly noted on x-ray.  Labs are reassuring, she is on metoprolol, going to reconsult cardiology because she is a stroke risk, may have to put on blood thinners or cardiovert her    I did talk to Dr. Carmelina Noun Cardiology and based on history and evaluation here, recommend doing PO eliquis 5 mg BID and keep on metoprolol and then if stable in a month, possible cardiovert her.  Patient is good with this plan and I think this is reasonable.  I will prescribe her medications to Jamelle Haring, do follow up with Cardiology within a month.  D/c home stable      ED Course       CONSULTATIONS:      I feel the patient is stable for discharge at this time. I have discussed the emergency department evaluation findings, disposition, and the treatment plan prior to discharge. Indications for emergent reevaluation were discussed and questions were answered.  The patient verbalized understanding and agreement with the treatment plan.    Critical Care Time Spent:       FINAL IMPRESSION    1. Chronic atrial fibrillation (HCC)        Disposition and Plan:  The patient was discharged home in good condition.    Medications: Continue your current medications as prescribed.  Medications started at discharge:   New Prescriptions    APIXABAN (ELIQUIS) 5 MG TABLET    Take 1 tablet by mouth 2 times daily for 90 days.    METOPROLOL TARTRATE (LOPRESSOR) 25 MG TABLET    Take 2 tablets by mouth 2 times daily.     Follow-up: No follow-up provider specified.       Denzil Hughes, NP  03/14/23 1241

## 2023-03-19 NOTE — Progress Notes (Signed)
OFFICE VISIT Claudia Soto  DOB:  [birth year]   MRN:   [id number]  Date of Service: 03/19/2023       CHIEF COMPLAINT      Chief Complaint   Patient presents with   . Atrial Fibrillation     Pt is present for ED f/u new onset A fib, she has lots of questions as to what her dx is and where we go from here.     HISTORY OF PRESENT ILLNESS   Claudia Soto is a 62 y.o. female who presents for follow up from an ER visit where she was diagnosed with Atrial fib with RVR. She is pending a referral to cardiology. They had offered to cardiovert her, but 'that scares her'. She went in a second time due to a sharp pain in her head. That was a negative workup, and she has not had any reoccurrence.She can feel the palpitations and gets short of breath easily. She feels very anxious about her new diagnosis. She also is wanting to try weight loss drugs, feeling this will help with her medical condition. Reassured her that atrial fib isn't an unusual problem, and can sometimes be cardioverted or most times, just controlled with medications. Reassured her that she can use her TENS unit like she previously did. She denies any Has, dizziness, chest pain and dypsnea only with exertion.         REVIEW OF SYSTEMS   Review of Systems   Constitutional:  Positive for malaise/fatigue. Negative for chills, fever and weight loss.        Increase stress/anxiety-recently started on metoprolol for her atrial fib   Respiratory:  Positive for shortness of breath. Negative for cough.         Feels deconditioned-dyspnea only with exertion   Cardiovascular:  Positive for palpitations. Negative for chest pain and leg swelling.        Recently diagnosed with atrial fib   Gastrointestinal: Negative.    Musculoskeletal:  Positive for joint pain, myalgias and neck pain.        Hx of chronic pain patient-uses TENS unit   Skin:  Negative for itching and rash.   Neurological:  Negative for dizziness, weakness and headaches.   Endo/Heme/Allergies:   Bruises/bleeds easily.        Started on Eliquis due to her atrial fib   Psychiatric/Behavioral:  Positive for depression. Negative for suicidal ideas. The patient is nervous/anxious.         Increase stress and anxiety secondary to new diagnosis-atrial fib. Also lots of depression over her weight     PAST MEDICAL HISTORY   No past medical history on file.  PAST SURGICAL HISTORY   No past surgical history on file.  FAMILY HISTORY   family history is not on file.  SOCIAL HISTORY    reports that she has never smoked. She has never used smokeless tobacco. She reports that she does not drink alcohol and does not use drugs.  MEDICATIONS     Current Outpatient Medications:   .  albuterol 90 mcg/puff inhaler, Inhale 2 puffs into the lungs every 4 hours as needed., Disp: , Rfl:   .  apixaban (ELIQUIS) 5 mg tablet, Take 1 tablet by mouth 2 times daily for 90 days., Disp: 60 tablet, Rfl: 2  .  Ascorbic Acid (VITAMIN C PO), Take by mouth., Disp: , Rfl:   .  fluticasone (FLONASE) 50 mcg/nasal spray, 2 sprays by Nasal route Daily., Disp: , Rfl:   .  fluticasone-salmeterol (ADVAIR DISKUS) 100-50 mcg/puff diskus inhaler, INHALE 1 PUFF BY MOUTH TWICE A DAY, Disp: 60 each, Rfl: 11  .  metoprolol tartrate (LOPRESSOR) 25 mg tablet, Take 2 tablets by mouth 2 times daily., Disp: 120 tablet, Rfl: 2  .  Multiple Vitamin TABS, Take 1 tablet by mouth Daily., Disp: , Rfl:   .  TURMERIC PO, Take by mouth., Disp: , Rfl:   .  VITAMIN D, CHOLECALCIFEROL, PO, Take by mouth., Disp: , Rfl:   .  Zinc Acetate, Oral, (ZINC ACETATE PO), Take by mouth., Disp: , Rfl:   ALLERGIES     Allergies   Allergen Reactions   . Citrus Bioflavonoid Unknown   . Egg Unknown   . Vicodin [Hydrocodone] Nausea And Vomiting      VITAL SIGNS   BP 128/81   Pulse 125   Temp 36.4 ?C (97.5 ?F)   Ht 1.702 m (5' 7)   Wt 122.3 kg (269 lb 9.6 oz)   SpO2 95%   BMI 42.23 kg/m?   PHYSICAL EXAM   Physical Exam  Constitutional:       General: She is not in acute distress.      Appearance: She is obese.   Cardiovascular:      Rate and Rhythm: Normal rate. Rhythm irregular.      Pulses: Normal pulses.      Heart sounds: No murmur heard.  Pulmonary:      Effort: Pulmonary effort is normal.      Breath sounds: Normal breath sounds.   Abdominal:      General: Bowel sounds are normal.   Musculoskeletal:      Cervical back: Normal range of motion and neck supple.   Skin:     General: Skin is warm and dry.   Neurological:      General: No focal deficit present.      Mental Status: She is alert and oriented to person, place, and time.      Motor: No weakness.   Psychiatric:         Mood and Affect: Mood normal.         Behavior: Behavior normal.         Thought Content: Thought content normal.         Judgment: Judgment normal.        ASSESSMENT , ORDERS AND PLAN   No results found for this or any previous visit (from the past 24 hour(s)).   1. Atrial fibrillation, unspecified type (HCC) (Primary)    2. Class 3 severe obesity with body mass index (BMI) of 40.0 to 44.9 in adult, unspecified obesity type, unspecified whether serious comorbidity present (HCC)     Continue with medications as prescribed. If your rate goes back up and you develop chest pain/dyspnea-go to the ER. Addressed her concerns about what she can and can not do. She should try to at least walk daily-don't push yourself-walk until you start to feel dyspnic, rest. Discouraged trying to take medicine for weight loss. Discussed starting the Mediterranean diet combined with intermittent fasting. To follow up as scheduled in one month with PCP, sooner if any problems    Jaci Lazier, NP  DATE OF SERVICE 03/19/2023     Electronically Signed by:

## 2023-03-23 ENCOUNTER — Ambulatory Visit: Admit: 2023-03-23 | Discharge: 2023-03-23 | Payer: PRIVATE HEALTH INSURANCE | Attending: MD | Primary: MD

## 2023-03-23 ENCOUNTER — Other Ambulatory Visit: Admit: 2023-03-23 | Discharge: 2023-03-23 | Payer: PRIVATE HEALTH INSURANCE | Primary: MD

## 2023-03-23 DIAGNOSIS — I4891 Unspecified atrial fibrillation: Secondary | ICD-10-CM

## 2023-03-23 LAB — COMPREHENSIVE METABOLIC PANEL
ALT - Alanine Aminotransferase: 92 [IU]/L — ABNORMAL HIGH (ref 7–52)
AST - Aspartate Aminotransferase: 45 [IU]/L — ABNORMAL HIGH (ref 8–39)
Albumin/Globulin Ratio: 1.9 (ref >0.9)
Albumin: 4.3 g/dL (ref 3.5–5.0)
Alkaline Phosphatase: 149 [IU]/L — ABNORMAL HIGH (ref 34–104)
Anion Gap: 12 mmol/L (ref 4–13)
BUN: 18 mg/dL (ref 6–23)
Bilirubin Total: 0.6 mg/dL (ref 0.3–1.2)
CO2 - Carbon Dioxide: 20 mmol/L — ABNORMAL LOW (ref 21–31)
Calcium: 9.4 mg/dL (ref 8.6–10.3)
Chloride: 107 mmol/L (ref 98–111)
Creatinine: 0.68 mg/dL (ref 0.55–1.10)
Globulin: 2.3 g/dL (ref 2.0–3.7)
Glomerular Filtration Rate Estimate (Female): >60 mL/min/{1.73_m2} (ref >=60)
Glucose: 100 mg/dL — ABNORMAL HIGH (ref 80–99)
Potassium: 4.2 mmol/L (ref 3.5–5.1)
Protein Total: 6.6 g/dL (ref 6.0–8.0)
Sodium: 139 mmol/L (ref 135–143)

## 2023-03-23 LAB — CBC WITH AUTO DIFFERENTIAL
Basophils %: 1 % (ref 0–2)
Basophils, Absolute: 0.1 10*3/ÂµL (ref 0.0–0.2)
Eosinophils %: 1 % (ref 0–7)
Eosinophils, Absolute: 0.1 10*3/ÂµL (ref 0.0–0.7)
HCT: 36.2 % — ABNORMAL LOW (ref 37.0–48.0)
Hemoglobin: 12.3 g/dL (ref 12.0–16.0)
Lymphocytes %: 24 % — ABNORMAL LOW (ref 25–45)
Lymphocytes, Absolute: 2.3 10*3/ÂµL (ref 1.1–4.3)
MCH: 29.2 pg (ref 27.0–34.0)
MCHC: 33.9 g/dL (ref 32.0–36.0)
MCV: 86.1 fL (ref 81.0–99.0)
MPV: 9.4 fL (ref 7.4–10.4)
Monocytes %: 6 % (ref 0–12)
Monocytes, Absolute: 0.6 10*3/ÂµL (ref 0.0–1.2)
Neutrophils %: 68 % (ref 35–70)
Neutrophils, Absolute: 6.6 10*3/ÂµL (ref 1.6–7.3)
Platelet Count: 235 10*3/ÂµL (ref 150–400)
RBC: 4.2 10*6/ÂµL (ref 4.20–5.40)
RDW: 15.1 % — ABNORMAL HIGH (ref 11.5–14.5)
WBC: 9.7 10*3/ÂµL (ref 4.8–10.8)

## 2023-03-23 LAB — TSH W/ REFLEX FREE T4: TSH - Thyroid Stimulating Hormone: 1.9 u[IU]/mL (ref 0.45–5.33)

## 2023-03-23 MED ORDER — metoprolol tartrate (LOPRESSOR) 100 mg tablet
100 | ORAL_TABLET | Freq: Two times a day (BID) | ORAL | 3 refills | 90.00000 days | Status: AC
Start: 2023-03-23 — End: 2024-03-22

## 2023-03-23 MED ORDER — apixaban (ELIQUIS) 5 mg tablet
5 | ORAL_TABLET | Freq: Two times a day (BID) | ORAL | 3 refills | Status: AC
Start: 2023-03-23 — End: 2024-03-22

## 2023-03-23 NOTE — Progress Notes (Signed)
_________________________  Clinical Resume  Obesity  BMI 42   OSA  On CPAP  Atrial fibrillation  New diagnosis earlier this month, on apixaban and metoprolol    __________________________  Chief Complaint  Atrial fibrillation  __________________________  History of Present Illness  It was a pleasure to see Claudia Soto in the cardiology clinic.  The patient is a pleasant 62 y.o. female with a history of obesity and new diagnosis of atrial fibrillation who presents for evaluation and management of atrial fibrillation    The patient says that she was in her usual state of health on October 11 when she suddenly felt shortness of breath and fluttering in her chest.  Prior to this, the patient says that she has had intermittent palpitations, however this was a different sensation.  She was seen in the ED at that time troponins were negative, labs otherwise unremarkable, but she was found to be in atrial fibrillation.  She was started on apixaban on 03/14/2023 in addition to metoprolol.  She was offered cardioversion, but it was not explained well to her and she declined.  Since then she has been feeling very unwell.  Endorses significant fatigue, ongoing palpitations, shortness of breath.  Reviewed heart rate trends from her smart watch.  This shows that her heart rate has been consistently in the 130s to 150s range for over a week.    __________________________  Medications  Outpatient Medication List  as of 03/23/2023               albuterol (VENTOLIN HFA) 90 mcg/actuation inhaler Inhale 2 puffs into the lungs every 4 hours as needed for Wheezing.    BLACK COHOSH ORAL Take by mouth daily.    CALCIUM-MAGNESIUM ORAL Take by mouth daily.    cetirizine (ZYRTEC) 10 mg Cap Take by mouth as needed.     CUSTOM MED daily. Med Name: Thyroid Energy- once daily    Bladderwrack- once daily   Garlinase 1 per day  Grapefruit seed extract 2 per day  Pay d'arco 500mg  2 per day  DGL 2 3 times a day    cyanocobalamin,  vitamin B-12, (VITAMIN B-12) 1,000 mcg Subl Place under the tongue daily.    DIMENHYDRINATE (DRAMAMINE ORAL) Take by mouth as needed.    fluticasone (FLONASE) 50 mcg/actuation nasal spray 2 sprays by Each Nare route daily.     fluticasone propion-salmeteroL (ADVAIR) 100-50 mcg/dose diskus inhaler Inhale 1 puff into the lungs 2 times daily.    MULTIVITAMIN ORAL Take by mouth daily. Women's Multi    non-formulary Cpap    apixaban (ELIQUIS) 5 mg tablet Take 1 tablet by mouth 2 times daily.    IBUPROFEN (ADVIL ORAL) Take by mouth as needed.    metoprolol tartrate (LOPRESSOR) 100 mg tablet Take 1 tablet by mouth 2 times daily.          Allergies reviewed.  __________________________  Social/Family  History  Non-smoker, no drugs.  __________________________  ROS  As per HPI otherwise negative in detail.  __________________________  Physical Exam  Vitals:    03/23/23 1116   Pulse: (!) 150   SpO2: 97%   Weight: 269 lb 6.4 oz (122.2 kg)   Height: 5' 7 (1.702 m)      __  Gen:  Alert, cooperative, NAD  Neck:  No JVD  Resp:  CTAB  CV:      Irregularly irregular, nml S1, S2  EXT:  No edema, pulses 1+   __________________________  Diagnostic Studies  Relevant EKG, diagnostic and radiological studies have been reviewed.    EKG today atrial fibrillation, heart rate 150 bpm, right bundle branch block  __________________________  Assessment and Plan    Atrial fibrillation with rapid ventricular response   -Increase metoprolol to 100 mg metoprolol tartrate twice daily   -Continue apixaban 5 mg twice daily   -I asked the patient to continue monitoring her heart rate and blood pressure   -Plan for DCCV 3 weeks after starting anticoagulation   -We will also plan to do echocardiogram same day after DCCV   -Check CBC to ensure stable hemoglobin on systemic anticoagulation    2.  OSA   -Continue CPAP use  __________________________  Orders Placed This Encounter   Procedures    TSH with Reflex Free T4 -Routine    CBC with Auto Differential  -Routine    Comprehensive Metabolic Panel -Routine    Cardiac Procedures    POC EKG        Return in about 6 months (around 09/21/2023) for Primary Cardiologist.    Kalman Drape, MD   [facility]    Please note: Due to the inherent limitations of voice recognition software, portions of this note may contain occasional sound-alike or wrong word substitution errors. I have proofread the note to minimize errors, but occasionally miss them.  Please call me if you need clarification or have questions.

## 2023-03-23 NOTE — Patient Instructions (Signed)
It was great to see you today!  Please double your metoprolol dose (100mg  twice daily)  Keep monitoring your blood pressure and heart rate, good to do twice daily or more as you increase your medications  Call us, your PCP, or go to the ED if you faint or if your BP < 90/60  We will schedule a cardioversion to be done at [facility]  Continue your medications as prescribed    Please do not hesitate to reach out if you have any questions or concerns. We are happy to help!

## 2023-03-26 NOTE — Telephone Encounter (Signed)
Provider: Kathleene Hazel, MD    Reason for note: Pt called to update on v/s after metoprolol tart was increased to 100mg  BID. She reports she feels well and BP was 99/79 p 69.       Plan per Dr. Charlsie Merles:    1. Continue medications as ordered. Monitor for sx of hypotension as instructed.      Pt verbalized understanding and agrees to plan. Call back with further questions or concerns.    Wynetta Emery LPN

## 2023-03-26 NOTE — Telephone Encounter (Signed)
Provider:  Kathleene Hazel, MD    Cardioversion scheduled with [facility] on 04/08/2023 at 2:00 p.m.  Verbal instructions given to patient by phone and written instructions sent via MyChart as follows:    Admit NPO to [facility] at 1:00 p.m.  Clear liquids ok before 6:00 a.m.  CMP, CBC in chart from 03/23/2023.  PT to be done STAT on admit.  Continue Eliquis as directed.  No medications to hold.  Do not take vitamins or supplements morning of procedure.  Needs driver home.    Patient verbalized understanding and agreed to plan.    Wende Mott, Surgery Scheduler

## 2023-03-27 NOTE — H&P (Signed)
 _________________________  Clinical Resume  Obesity  BMI 42   OSA  On CPAP  Atrial fibrillation  New diagnosis earlier this month, on apixaban and metoprolol    __________________________  Chief Complaint  Atrial fibrillation  __________________________  History of Present Illness  It was a pleasure to see Claudia Soto in the cardiology clinic.  The patient is a pleasant 62 y.o. female with a history of obesity and new diagnosis of atrial fibrillation who presents for evaluation and management of atrial fibrillation    The patient says that she was in her usual state of health on October 11 when she suddenly felt shortness of breath and fluttering in her chest.  Prior to this, the patient says that she has had intermittent palpitations, however this was a different sensation.  She was seen in the ED at that time troponins were negative, labs otherwise unremarkable, but she was found to be in atrial fibrillation.  She was started on apixaban on 03/14/2023 in addition to metoprolol.  She was offered cardioversion, but it was not explained well to her and she declined.  Since then she has been feeling very unwell.  Endorses significant fatigue, ongoing palpitations, shortness of breath.  Reviewed heart rate trends from her smart watch.  This shows that her heart rate has been consistently in the 130s to 150s range for over a week.    __________________________  Medications  Outpatient Medication List  as of 03/23/2023               albuterol (VENTOLIN HFA) 90 mcg/actuation inhaler Inhale 2 puffs into the lungs every 4 hours as needed for Wheezing.    BLACK COHOSH ORAL Take by mouth daily.    CALCIUM-MAGNESIUM ORAL Take by mouth daily.    cetirizine (ZYRTEC) 10 mg Cap Take by mouth as needed.     CUSTOM MED daily. Med Name: Thyroid Energy- once daily    Bladderwrack- once daily   Garlinase 1 per day  Grapefruit seed extract 2 per day  Pay d'arco 500mg  2 per day  DGL 2 3 times a day    cyanocobalamin,  vitamin B-12, (VITAMIN B-12) 1,000 mcg Subl Place under the tongue daily.    DIMENHYDRINATE (DRAMAMINE ORAL) Take by mouth as needed.    fluticasone (FLONASE) 50 mcg/actuation nasal spray 2 sprays by Each Nare route daily.     fluticasone propion-salmeteroL (ADVAIR) 100-50 mcg/dose diskus inhaler Inhale 1 puff into the lungs 2 times daily.    MULTIVITAMIN ORAL Take by mouth daily. Women's Multi    non-formulary Cpap    apixaban (ELIQUIS) 5 mg tablet Take 1 tablet by mouth 2 times daily.    IBUPROFEN (ADVIL ORAL) Take by mouth as needed.    metoprolol tartrate (LOPRESSOR) 100 mg tablet Take 1 tablet by mouth 2 times daily.          Allergies reviewed.  __________________________  Social/Family  History  Non-smoker, no drugs.  __________________________  ROS  As per HPI otherwise negative in detail.  __________________________  Physical Exam  Vitals:    03/23/23 1116   Pulse: (!) 150   SpO2: 97%   Weight: 269 lb 6.4 oz (122.2 kg)   Height: 5' 7 (1.702 m)      __  Gen:  Alert, cooperative, NAD  Neck:  No JVD  Resp:  CTAB  CV:      Irregularly irregular, nml S1, S2  EXT:  No edema, pulses 1+   __________________________  Diagnostic Studies  Relevant EKG, diagnostic and radiological studies have been reviewed.    EKG today atrial fibrillation, heart rate 150 bpm, right bundle branch block  __________________________  Assessment and Plan    Atrial fibrillation with rapid ventricular response   -Increase metoprolol to 100 mg metoprolol tartrate twice daily   -Continue apixaban 5 mg twice daily   -I asked the patient to continue monitoring her heart rate and blood pressure   -Plan for DCCV 3 weeks after starting anticoagulation   -We will also plan to do echocardiogram same day after DCCV   -Check CBC to ensure stable hemoglobin on systemic anticoagulation    2.  OSA   -Continue CPAP use  __________________________  Orders Placed This Encounter   Procedures    TSH with Reflex Free T4 -Routine    CBC with Auto Differential  -Routine    Comprehensive Metabolic Panel -Routine    Cardiac Procedures    POC EKG        Return in about 6 months (around 09/21/2023) for Primary Cardiologist.    Kalman Drape, MD   [facility]    Please note: Due to the inherent limitations of voice recognition software, portions of this note may contain occasional sound-alike or wrong word substitution errors. I have proofread the note to minimize errors, but occasionally miss them.  Please call me if you need clarification or have questions.

## 2023-03-31 NOTE — Telephone Encounter (Signed)
Provider: Kathleene Hazel, MD    Reason for note: Labs from 01/21/23 show elevated AST, ALT and Alk phosphate. Call to pt to discuss f/u. Pt also asks if white milk thistle is okay to take with her oac.      Plan per Dr. Charlsie Merles:    1. Labs discussed with pt. Pt to f/u with pcp in next few weeks which is already scheduled.   2. Avoid use of milk thistle while on oac.      Pt verbalized understanding and agrees to plan. Call back with further questions or concerns.    Wynetta Emery LPN

## 2023-04-02 NOTE — Telephone Encounter (Signed)
Provider Kathleene Hazel, MD    Reason for note:  Pt has a cardioversion scheduled with Dr. Charlsie Merles on 04/08/23.  Pt had CMP and CBC done 03/23/23.  CBC stable.  CMP with elevated ALT and Alkaline phosphatase that has already been addressed with Pt.  PT lab to be done STAT on admit.    Manya Silvas, RN

## 2023-04-06 NOTE — Telephone Encounter (Signed)
Provider: Kathleene Hazel, MD    Reason for note: Pt called reporting v/s have not been taken reliably d/t afib. She does get constant alerts for her afib over 130bpm. No change in sx pt reports doing activity as tolerated. No acute limiting sx at this time. Cardioversion scheduled for Wednesday.      Plan per Dr. Charlsie Merles:    1. Continue medications as prescribed.  2. F/u for cardioversion on Wednesday as planned.      Pt verbalized understanding and agrees to plan. Call back with further questions or concerns.    Wynetta Emery LPN

## 2023-04-08 ENCOUNTER — Inpatient Hospital Stay: Admit: 2023-04-08 | Payer: PRIVATE HEALTH INSURANCE | Attending: MD | Primary: MD

## 2023-04-08 VITALS — BP 141/101 | HR 140 | Temp 97.60000°F | Resp 15 | Ht 67.0 in | Wt 269.0 lb

## 2023-04-08 DIAGNOSIS — I4891 Unspecified atrial fibrillation: Secondary | ICD-10-CM

## 2023-04-08 DIAGNOSIS — I4819 Other persistent atrial fibrillation: Secondary | ICD-10-CM

## 2023-04-08 LAB — PROTIME-INR
INR: 1.3 — ABNORMAL HIGH (ref 0.9–1.2)
Protime: 15 s — ABNORMAL HIGH (ref 10.0–13.0)

## 2023-04-08 MED ORDER — propofoL (DIPRIVAN) injection 25 mg
10 | INTRAVENOUS | Status: DC | PRN
Start: 2023-04-08 — End: 2023-04-08

## 2023-04-08 MED ORDER — sodium chloride 0.9 % (NS) infusion
INTRAVENOUS | Status: DC
Start: 2023-04-08 — End: 2023-04-08

## 2023-04-08 MED FILL — DIPRIVAN 10 MG/ML INTRAVENOUS EMULSION: 10 mg/mL | INTRAVENOUS | Qty: 20

## 2023-04-08 NOTE — Progress Notes (Signed)
Cardioversion Procedure Note  04/08/2023    PCP: Dr. Reola Mosher    Indication: 62 year old female with a history of obesity, OSA on CPAP, and new atrial fibrillation who presents for elective cardioversion.  She has been on apixaban 5 mg twice daily for 4 weeks without any missed doses.    Procedure Details: The patient was counseled as to the risks and benefits of the procedure and wishes to proceed. They were brought to the CVR in the fasting state. Atrial fibrillation was confirmed on telemetry. They were connected to monitoring equipment. Defibrillator pads were placed in the anterior-posterior fashion. Incremental doses of propofol were administered until adequate analgesia and sedation were achieved. Once accomplished, a single 200 J synchronized biphasic shock was delivered. This was unsuccessful in restoring sinus rhythm.  Synchronized biphasic shocks were repeated with 300 J and 360 J, all unsuccessful in converting the patient to sinus rhythm.  The patient awoke to their usual state. There were no immediate complications.    Meds: Propofol 50 mg    Plan:  -Continue anticoagulation with apixaban 5 mg twice daily  -Start amiodarone 400 mg daily   -LFTs slightly elevated at baseline, these will need to be monitored on amiodarone therapy   -TSH within normal limits at baseline, will also need to be monitored  -Continue metoprolol 100 mg twice daily  -Patient has upcoming primary care appointment, please uptitrate metoprolol or add diltiazem as needed for better rate control  -Echocardiogram  -Follow-up with me in 3 months, we will discuss attempting repeat cardioversion versus antiarrhythmic strategy      Kalman Drape, MD

## 2023-04-08 NOTE — Progress Notes (Signed)
IV removed with catheter intact.

## 2023-04-08 NOTE — Pre Sedation (Signed)
Unsuccessful cardioversion x 3 attempts (200J, 300J and 360J), remains in a fib.

## 2023-04-08 NOTE — Progress Notes (Signed)
 Written and verbal discharge instructions provided, questions answered and patient states understanding.

## 2023-04-09 ENCOUNTER — Inpatient Hospital Stay: Payer: PRIVATE HEALTH INSURANCE | Attending: MD | Primary: MD

## 2023-04-09 MED ORDER — amiodarone (PACERONE) 400 mg tablet
400 | ORAL_TABLET | Freq: Every day | ORAL | 3 refills | 90.00000 days | Status: DC
Start: 2023-04-09 — End: 2023-04-21

## 2023-04-09 NOTE — Telephone Encounter (Signed)
Left a vm for Pt in regards to moving her appt from May to Feb per [facility] staff message/     Please [place]

## 2023-04-16 NOTE — Telephone Encounter (Signed)
Provider: Kathleene Hazel, MD    Reason for note: Return call to pt regarding f/u on echocardiogram and visit with PCP. She reports bp was 115/80. Pulse was irregular but not rapid. No meds were changed during visit. It was clarified pt does not need to wait 3 months before echo is completed. Number given to pt to schedule imaging.      Plan per Dr. Charlsie Merles:    1. F/u as planned.       Pt verbalized understanding and agrees to plan. Call back with further questions or concerns.    Wynetta Emery LPN

## 2023-04-21 MED ORDER — amiodarone (PACERONE) 200 mg tablet
200 | Freq: Every day | ORAL | 0.00 refills | 90.00000 days | Status: DC
Start: 2023-04-21 — End: 2023-05-12

## 2023-04-21 NOTE — Telephone Encounter (Signed)
Provider: Kathleene Hazel, MD    Reason for note: PT called reporting significant fatigue since starting amiodarone. She feels tired all the time even right after waking up and sometimes needs more than one nap in a day. PT does report sx from afib have improved but heart rate still varies between 60-130bpm.       Plan per Dr. Charlsie Merles:    1. Reduce amiodarone to 300 mg QD.  2. Monitor sx of fatigue and HR x 1 week and call back to discuss. Call back if sx worsen.      Pt verbalized understanding and agrees to plan. Call back with further questions or concerns.    Wynetta Emery LPN

## 2023-05-06 ENCOUNTER — Ambulatory Visit: Payer: PRIVATE HEALTH INSURANCE | Primary: MD

## 2023-05-12 NOTE — Telephone Encounter (Signed)
Provider: Kathleene Hazel, MD    Reason for note: Pt called regarding concerns about amiodarone. She reports end goal is to get off medication. She states since on amio 300mg  QD she is mostly having controlled heart rhythm and rates with few episodes of afib. She worries about long term side effects.      Plan per Dr. Charlsie Merles:    1. Reduce amiodarone to 200mg  QD. Monitor sx for 1-2 weeks and call back to check in.  2. F/u as planned with echo on Friday.      Pt verbalized understanding and agrees to plan. Call back with further questions or concerns.    Wynetta Emery LPN

## 2023-05-13 MED ORDER — amiodarone (PACERONE) 200 mg tablet
200 | Freq: Every day | ORAL | 0.00 refills | 90.00000 days | Status: AC
Start: 2023-05-13 — End: 2024-05-11

## 2023-05-15 ENCOUNTER — Inpatient Hospital Stay: Admit: 2023-05-15 | Payer: PRIVATE HEALTH INSURANCE | Attending: MD | Primary: MD

## 2023-05-15 DIAGNOSIS — I4819 Other persistent atrial fibrillation: Secondary | ICD-10-CM

## 2023-05-20 ENCOUNTER — Ambulatory Visit: Payer: PRIVATE HEALTH INSURANCE | Primary: MD

## 2023-05-21 NOTE — Telephone Encounter (Signed)
Provider: Kathleene Hazel, MD    Reason for note: Echocardiogram from 05/15/23 shows ef 40-45%. HR 110bpm during exam.      Plan per Dr. Charlsie Merles:    1. Increase amiodarone back to 400mg  QD.  2. F/u in 2 months as planned.       Pt verbalized understanding and agrees to plan. Call back with further questions or concerns.    Wynetta Emery LPN

## 2023-05-28 NOTE — Telephone Encounter (Signed)
LM #2 today to discuss pt sx on higher dose of amiodarone and titrating metoprolol.

## 2023-05-28 NOTE — Telephone Encounter (Signed)
Provider: Kathleene Hazel, MD    Reason for note: Call to pt to discuss sx of rapid afib. She reports she does have intermittent lightheadedness on higher dose of amiodarone. She also is now able to sleep on both sides and is able to remain in her bed through the night. Overall sleep quality is greatly improved. She wishes to leave things how they are for now until appt. In February.      Plan per Dr. Charlsie Merles:    1. Continue amiodarone 400mg  QD.  2. Call back with any significant changes.  3. F/u as planned in February 2025.      Pt verbalized understanding and agrees to plan. Call back with further questions or concerns.    Wynetta Emery LPN

## 2023-05-28 NOTE — Telephone Encounter (Signed)
LM to discuss HR after med change, will titrate metoprolol further if v/s allow.

## 2023-06-22 NOTE — Telephone Encounter (Signed)
Provider: Kathleene Hazel MD    Reason for Note: Pt called and states that she has been concerned about her HR over the last few days since spontaneously converting from afib to NSR on 06/18/23. She has been taking her HR at home with her smart watch and BP cuff and it has read between 45-60+ bpm. It seems that her HR is not persistently around 40 bpm, but seems to jump around in that range. Her BP has been WNL, 108/69 and 128/80 the last two days. She reports some nausea and fatigue yesterday, but no other symptoms any other day. Denies chest pain, palpitations, SOB, dizziness, near syncope, syncope or other abnormalities. She went to the ER 06/19/23 for these concerns and they reduced her metoprolol tartrate from 100 mg BID to 50 mg BID. She is also on eliquis 5 mg BID and amiodarone 400 mg QD, which was recently increased around 05/28/23.  Pt wants to decrease her amiodarone because she does not like being on the medication d/t the potential side effects.     Plan per Dr. Charlsie Merles:    Advised pt that message will be sent to Dr. Charlsie Merles regarding her concerns and we will f/u with recommendations once received. She should continue her metoprolol tartrate 50 mg BID and amiodarone 400 mg QD as prescribed at this time.     She will continue daily BP/HR log. If her HR is persistently <50 bpm and/or if she develops symptoms as above she will contact us sooner. She will go to the ER for any sudden changes in symptoms.    Pt verbalized understanding and agrees with plan. Pt to call back with any further questions or concerns.    Gwenith Daily RN

## 2023-06-23 NOTE — Telephone Encounter (Signed)
PATIENT CALLED BACK REGARDING THE MEDICATION THAT WAS DICUSSED WITH HER PCP YESTERDAY , CARDIO CALLED PATIENT BACK THIS AM  AND TOLD PATIENT TO STOP TAKING THAT MEDICATION SO SHE'S STOPPED TAKING IT JUST WANTED TO LET HER PCP KNOW SHE STATED.

## 2023-07-01 NOTE — Telephone Encounter (Signed)
Briefly spoke with Pt in regards to rescheduling her appt with [facility] in [place]/ Pt was very upset when she was told we needed to reschedule and stated she would call me back    PLE[facility] [place]     [facility]

## 2023-07-01 NOTE — Telephone Encounter (Signed)
Transfer request sent.

## 2023-07-13 ENCOUNTER — Encounter: Payer: PRIVATE HEALTH INSURANCE | Attending: MD | Primary: MD

## 2023-07-13 NOTE — Telephone Encounter (Signed)
Dr. Alfred Levins accepted Calley's request to transfer her care to him.

## 2023-07-20 NOTE — Telephone Encounter (Signed)
I called back to schedule [facility] transfer appt. Patient declined and stated she is looking for a cardiologist elsewhere.

## 2023-10-05 ENCOUNTER — Encounter: Attending: MD | Primary: MD

## 2023-11-03 ENCOUNTER — Encounter: Attending: MD | Primary: MD

## 2023-11-16 ENCOUNTER — Other Ambulatory Visit: Payer: Self-pay | Admitting: Physician Assistant

## 2023-11-16 DIAGNOSIS — Z1231 Encounter for screening mammogram for malignant neoplasm of breast: Secondary | ICD-10-CM

## 2023-11-30 ENCOUNTER — Ambulatory Visit
Admission: RE | Admit: 2023-11-30 | Discharge: 2023-11-30 | Disposition: A | Discharge status: Home / Self Care | Source: Ambulatory Visit

## 2023-11-30 DIAGNOSIS — Z1231 Encounter for screening mammogram for malignant neoplasm of breast: Secondary | ICD-10-CM

## 2024-03-14 ENCOUNTER — Other Ambulatory Visit (HOSPITAL_BASED_OUTPATIENT_CLINIC_OR_DEPARTMENT_OTHER): Payer: Self-pay | Admitting: Physician Assistant

## 2024-03-14 DIAGNOSIS — M810 Age-related osteoporosis without current pathological fracture: Secondary | ICD-10-CM

## 2024-03-25 ENCOUNTER — Other Ambulatory Visit (HOSPITAL_BASED_OUTPATIENT_CLINIC_OR_DEPARTMENT_OTHER): Admitting: Radiology

## 2024-03-28 ENCOUNTER — Ambulatory Visit (INDEPENDENT_AMBULATORY_CARE_PROVIDER_SITE_OTHER)
Admission: RE | Admit: 2024-03-28 | Discharge: 2024-03-28 | Disposition: A | Discharge status: Home / Self Care | Source: Ambulatory Visit | Attending: Physician Assistant | Admitting: Physician Assistant

## 2024-03-28 DIAGNOSIS — M810 Age-related osteoporosis without current pathological fracture: Secondary | ICD-10-CM | POA: Diagnosis not present
